# Patient Record
Sex: Female | Born: 1960 | Race: Black or African American | Hispanic: No | Marital: Married | Smoking: Never smoker
Health system: Southern US, Community
[De-identification: ages and names within clinical notes are randomized; demographics above are authoritative.]

## PROBLEM LIST (undated history)

## (undated) DIAGNOSIS — E559 Vitamin D deficiency, unspecified: Secondary | ICD-10-CM

## (undated) DIAGNOSIS — M8580 Other specified disorders of bone density and structure, unspecified site: Secondary | ICD-10-CM

## (undated) DIAGNOSIS — G629 Polyneuropathy, unspecified: Secondary | ICD-10-CM

## (undated) DIAGNOSIS — E119 Type 2 diabetes mellitus without complications: Secondary | ICD-10-CM

## (undated) DIAGNOSIS — G2581 Restless legs syndrome: Secondary | ICD-10-CM

## (undated) DIAGNOSIS — G47419 Narcolepsy without cataplexy: Secondary | ICD-10-CM

## (undated) DIAGNOSIS — M797 Fibromyalgia: Secondary | ICD-10-CM

## (undated) DIAGNOSIS — J302 Other seasonal allergic rhinitis: Secondary | ICD-10-CM

## (undated) HISTORY — DX: Polyneuropathy, unspecified: G62.9

## (undated) HISTORY — PX: CARPAL TUNNEL RELEASE: SHX101

## (undated) HISTORY — PX: TONSILLECTOMY: SUR1361

## (undated) HISTORY — DX: Vitamin D deficiency, unspecified: E55.9

## (undated) HISTORY — PX: KNEE ARTHROSCOPY: SUR90

## (undated) HISTORY — DX: Type 2 diabetes mellitus without complications: E11.9

## (undated) HISTORY — DX: Other specified disorders of bone density and structure, unspecified site: M85.80

## (undated) HISTORY — DX: Fibromyalgia: M79.7

## (undated) HISTORY — DX: Restless legs syndrome: G25.81

## (undated) HISTORY — DX: Narcolepsy without cataplexy: G47.419

---

## 1998-02-16 ENCOUNTER — Encounter: Payer: Self-pay | Admitting: Internal Medicine

## 1998-02-16 ENCOUNTER — Ambulatory Visit (HOSPITAL_COMMUNITY): Admission: RE | Admit: 1998-02-16 | Discharge: 1998-02-16 | Payer: Self-pay | Admitting: Internal Medicine

## 1998-02-18 ENCOUNTER — Ambulatory Visit (HOSPITAL_COMMUNITY): Admission: RE | Admit: 1998-02-18 | Discharge: 1998-02-18 | Payer: Self-pay | Admitting: Internal Medicine

## 1999-07-27 ENCOUNTER — Other Ambulatory Visit: Admission: RE | Admit: 1999-07-27 | Discharge: 1999-07-27 | Payer: Self-pay | Admitting: Gynecology

## 2000-01-19 ENCOUNTER — Encounter: Admission: RE | Admit: 2000-01-19 | Discharge: 2000-02-01 | Payer: Self-pay | Admitting: Internal Medicine

## 2000-05-30 ENCOUNTER — Encounter: Admission: RE | Admit: 2000-05-30 | Discharge: 2000-07-05 | Payer: Self-pay | Admitting: Internal Medicine

## 2000-08-01 ENCOUNTER — Other Ambulatory Visit: Admission: RE | Admit: 2000-08-01 | Discharge: 2000-08-01 | Payer: Self-pay | Admitting: Gynecology

## 2000-10-05 ENCOUNTER — Encounter: Admission: RE | Admit: 2000-10-05 | Discharge: 2000-10-05 | Payer: Self-pay | Admitting: Gynecology

## 2000-10-05 ENCOUNTER — Encounter: Payer: Self-pay | Admitting: Gynecology

## 2002-02-14 ENCOUNTER — Ambulatory Visit (HOSPITAL_BASED_OUTPATIENT_CLINIC_OR_DEPARTMENT_OTHER): Admission: RE | Admit: 2002-02-14 | Discharge: 2002-02-14 | Payer: Self-pay | Admitting: Internal Medicine

## 2002-05-12 ENCOUNTER — Ambulatory Visit (HOSPITAL_BASED_OUTPATIENT_CLINIC_OR_DEPARTMENT_OTHER): Admission: RE | Admit: 2002-05-12 | Discharge: 2002-05-12 | Payer: Self-pay | Admitting: Neurology

## 2002-06-10 ENCOUNTER — Other Ambulatory Visit: Admission: RE | Admit: 2002-06-10 | Discharge: 2002-06-10 | Payer: Self-pay | Admitting: Gynecology

## 2002-07-01 ENCOUNTER — Encounter: Payer: Self-pay | Admitting: Gynecology

## 2002-07-01 ENCOUNTER — Encounter: Admission: RE | Admit: 2002-07-01 | Discharge: 2002-07-01 | Payer: Self-pay | Admitting: Gynecology

## 2003-07-21 ENCOUNTER — Other Ambulatory Visit: Admission: RE | Admit: 2003-07-21 | Discharge: 2003-07-21 | Payer: Self-pay | Admitting: Gynecology

## 2003-10-15 ENCOUNTER — Encounter: Admission: RE | Admit: 2003-10-15 | Discharge: 2003-10-15 | Payer: Self-pay | Admitting: Gynecology

## 2003-12-29 ENCOUNTER — Encounter: Admission: RE | Admit: 2003-12-29 | Discharge: 2004-01-22 | Payer: Self-pay | Admitting: Internal Medicine

## 2004-02-22 ENCOUNTER — Other Ambulatory Visit: Admission: RE | Admit: 2004-02-22 | Discharge: 2004-02-22 | Payer: Self-pay | Admitting: Gynecology

## 2004-07-21 ENCOUNTER — Other Ambulatory Visit: Admission: RE | Admit: 2004-07-21 | Discharge: 2004-07-21 | Payer: Self-pay | Admitting: Gynecology

## 2005-03-07 ENCOUNTER — Other Ambulatory Visit: Admission: RE | Admit: 2005-03-07 | Discharge: 2005-03-07 | Payer: Self-pay | Admitting: Gynecology

## 2005-08-08 ENCOUNTER — Other Ambulatory Visit: Admission: RE | Admit: 2005-08-08 | Discharge: 2005-08-08 | Payer: Self-pay | Admitting: Gynecology

## 2005-09-11 ENCOUNTER — Encounter: Admission: RE | Admit: 2005-09-11 | Discharge: 2005-09-11 | Payer: Self-pay | Admitting: Gynecology

## 2006-03-22 ENCOUNTER — Emergency Department (HOSPITAL_COMMUNITY): Admission: EM | Admit: 2006-03-22 | Discharge: 2006-03-22 | Payer: Self-pay | Admitting: Emergency Medicine

## 2006-04-04 ENCOUNTER — Ambulatory Visit (HOSPITAL_COMMUNITY): Admission: RE | Admit: 2006-04-04 | Discharge: 2006-04-04 | Payer: Self-pay | Admitting: Occupational Medicine

## 2006-04-23 ENCOUNTER — Ambulatory Visit (HOSPITAL_BASED_OUTPATIENT_CLINIC_OR_DEPARTMENT_OTHER): Admission: RE | Admit: 2006-04-23 | Discharge: 2006-04-23 | Payer: Self-pay | Admitting: Orthopedic Surgery

## 2006-05-04 ENCOUNTER — Encounter: Admission: RE | Admit: 2006-05-04 | Discharge: 2006-06-20 | Payer: Self-pay | Admitting: Orthopedic Surgery

## 2006-09-20 ENCOUNTER — Other Ambulatory Visit: Admission: RE | Admit: 2006-09-20 | Discharge: 2006-09-20 | Payer: Self-pay | Admitting: Gynecology

## 2006-11-28 ENCOUNTER — Encounter: Admission: RE | Admit: 2006-11-28 | Discharge: 2006-11-28 | Payer: Self-pay | Admitting: Gynecology

## 2007-09-15 ENCOUNTER — Emergency Department (HOSPITAL_COMMUNITY): Admission: EM | Admit: 2007-09-15 | Discharge: 2007-09-15 | Payer: Self-pay | Admitting: Family Medicine

## 2007-11-01 ENCOUNTER — Other Ambulatory Visit: Admission: RE | Admit: 2007-11-01 | Discharge: 2007-11-01 | Payer: Self-pay | Admitting: Gynecology

## 2008-09-07 ENCOUNTER — Emergency Department (HOSPITAL_COMMUNITY): Admission: EM | Admit: 2008-09-07 | Discharge: 2008-09-07 | Payer: Self-pay | Admitting: Emergency Medicine

## 2008-09-24 ENCOUNTER — Encounter: Admission: RE | Admit: 2008-09-24 | Discharge: 2008-09-24 | Payer: Self-pay | Admitting: Gynecology

## 2009-03-03 ENCOUNTER — Ambulatory Visit (HOSPITAL_COMMUNITY): Admission: RE | Admit: 2009-03-03 | Discharge: 2009-03-03 | Payer: Self-pay | Admitting: Internal Medicine

## 2009-03-06 HISTORY — PX: OTHER SURGICAL HISTORY: SHX169

## 2009-04-16 ENCOUNTER — Ambulatory Visit (HOSPITAL_BASED_OUTPATIENT_CLINIC_OR_DEPARTMENT_OTHER): Admission: RE | Admit: 2009-04-16 | Discharge: 2009-04-16 | Payer: Self-pay | Admitting: Orthopedic Surgery

## 2009-05-08 ENCOUNTER — Ambulatory Visit (HOSPITAL_COMMUNITY): Admission: RE | Admit: 2009-05-08 | Discharge: 2009-05-08 | Payer: Self-pay | Admitting: Orthopedic Surgery

## 2009-11-09 ENCOUNTER — Encounter: Admission: RE | Admit: 2009-11-09 | Discharge: 2009-11-09 | Payer: Self-pay | Admitting: Gynecology

## 2010-03-26 ENCOUNTER — Encounter: Payer: Self-pay | Admitting: Gynecology

## 2010-07-22 NOTE — Op Note (Signed)
Diana White, Diana White                 ACCOUNT NO.:  [id number]   MEDICAL RECORD NO.:  [id number]          PATIENT TYPE:  AMB   LOCATION:  DSC                          FACILITY:  [facility]   PHYSICIAN:  Robert A. Thurston Hole, M.D. 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   PROCEDURES:  1. Left knee examination under anesthesia, followed by arthroscopic      partial lateral meniscectomy.  2. Left knee chondroplasty.   SURGEON:  Elana Alm. Thurston Hole, M.D.   ASSISTANT:  Julien Girt, P.A.   ANESTHESIA:  General.   OPERATIVE TIME:  30 minutes.   COMPLICATIONS:  None.   INDICATIONS FOR PROCEDURE:  [name] is a 50 year old woman, who has  had significant left knee pain from an injury at work on March 22, 2006, a twisting type injury.  Significant pain and swelling.  Exam and  MRI reveal a lateral meniscus tear.  She has failed conservative care  and is now to undergo arthroscopy.   DESCRIPTION:  [name] was brought to the operating room on April 23, 2006, and placed on the operating table in supine position.  After  an adequate level of general anesthesia was obtained, her left knee was  examined.  She had a full range of motion and her knee was stable to  ligamentous exam.  The knee was injected under sterile conditions with  0.25% Marcaine with epinephrine.  Her left leg was then prepped using  sterile DuraPrep and draped using sterile technique.  Originally,  through an anterolateral portal, the arthroscope with a pump attached  was placed, and through an anteromedial portal an arthroscopic probe was  placed.  On initial inspection of the medial compartment, the articular  cartilage showed 30 to 40% grade 3 chondromalacia,  which was debrided.  The medial meniscus was intact.  The intercondylar notch was inspected  and the anterior and posterior cruciate ligaments were normal.  The  lateral compartment was inspected: 25 to 30% chondromalacia, which was  debrided.  Lateral meniscus tear, posterior and lateral horn, of which  30% was resected back to a stable rim.  The patellofemoral joint showed  30 to 40% grade 3 chondromalacia on the patellofemoral groove, and this  was debrided.  The patella tracked normally.  Moderate synovitis of the  medial and lateral gutters, otherwise, they were free of pathology.  After this was done and it was felt that all pathology had been  satisfactorily addressed, the instruments were removed.  The portals  were closed with 3-0 nylon suture and injected with 0.25% Marcaine with  epinephrine and 4 mg of morphine.  A sterile dressing was applied, and  the patient awakened and taken to the recovery room in stable condition.   FOLLOWUP CARE:  [name] will be followed as an outpatient on Vicodin  and Naprosyn.  I will see her back in the office in a week for sutures  out and followup.      Robert A. Thurston Hole, M.D.  Electronically Signed     RAW/MEDQ  D:  04/23/2006  T:  04/24/2006  Job:  [id number]   cc:   Workers Occupational hygienist.

## 2010-08-30 ENCOUNTER — Other Ambulatory Visit (HOSPITAL_COMMUNITY): Payer: Self-pay | Admitting: Internal Medicine

## 2010-08-30 DIAGNOSIS — K5792 Diverticulitis of intestine, part unspecified, without perforation or abscess without bleeding: Secondary | ICD-10-CM

## 2010-09-01 ENCOUNTER — Ambulatory Visit (HOSPITAL_COMMUNITY)
Admission: RE | Admit: 2010-09-01 | Discharge: 2010-09-01 | Disposition: A | Discharge status: Home / Self Care | Payer: 59 | Source: Ambulatory Visit | Attending: Internal Medicine | Admitting: Internal Medicine

## 2010-09-01 DIAGNOSIS — R1032 Left lower quadrant pain: Secondary | ICD-10-CM | POA: Insufficient documentation

## 2010-09-01 DIAGNOSIS — N859 Noninflammatory disorder of uterus, unspecified: Secondary | ICD-10-CM | POA: Insufficient documentation

## 2010-09-01 DIAGNOSIS — K5792 Diverticulitis of intestine, part unspecified, without perforation or abscess without bleeding: Secondary | ICD-10-CM

## 2010-09-01 MED ORDER — IOHEXOL 300 MG/ML  SOLN: 80.0000 mL | Freq: Once | INTRAMUSCULAR | Status: AC | PRN

## 2010-12-22 ENCOUNTER — Ambulatory Visit
Admission: RE | Admit: 2010-12-22 | Discharge: 2010-12-22 | Disposition: A | Discharge status: Home / Self Care | Payer: 59 | Source: Ambulatory Visit | Attending: Gynecology | Admitting: Gynecology

## 2010-12-22 ENCOUNTER — Other Ambulatory Visit: Payer: Self-pay | Admitting: Gynecology

## 2010-12-22 DIAGNOSIS — Z1231 Encounter for screening mammogram for malignant neoplasm of breast: Secondary | ICD-10-CM

## 2012-01-04 ENCOUNTER — Other Ambulatory Visit: Payer: Self-pay | Admitting: Gynecology

## 2012-01-04 DIAGNOSIS — Z1231 Encounter for screening mammogram for malignant neoplasm of breast: Secondary | ICD-10-CM

## 2012-02-16 ENCOUNTER — Ambulatory Visit: Payer: 59

## 2012-08-31 ENCOUNTER — Emergency Department (HOSPITAL_COMMUNITY)
Admission: EM | Admit: 2012-08-31 | Discharge: 2012-08-31 | Disposition: A | Discharge status: Home / Self Care | Payer: 59 | Attending: Emergency Medicine | Admitting: Emergency Medicine

## 2012-08-31 ENCOUNTER — Encounter (HOSPITAL_COMMUNITY): Payer: Self-pay | Admitting: Physical Medicine and Rehabilitation

## 2012-08-31 ENCOUNTER — Encounter (HOSPITAL_COMMUNITY): Payer: Self-pay | Admitting: Emergency Medicine

## 2012-08-31 ENCOUNTER — Emergency Department (HOSPITAL_COMMUNITY)
Admission: EM | Admit: 2012-08-31 | Discharge: 2012-08-31 | Disposition: A | Discharge status: Home / Self Care | Payer: 59 | Source: Home / Self Care | Attending: Emergency Medicine | Admitting: Emergency Medicine

## 2012-08-31 DIAGNOSIS — M79609 Pain in unspecified limb: Secondary | ICD-10-CM

## 2012-08-31 DIAGNOSIS — M79604 Pain in right leg: Secondary | ICD-10-CM

## 2012-08-31 DIAGNOSIS — IMO0001 Reserved for inherently not codable concepts without codable children: Secondary | ICD-10-CM | POA: Insufficient documentation

## 2012-08-31 DIAGNOSIS — M7989 Other specified soft tissue disorders: Secondary | ICD-10-CM

## 2012-08-31 DIAGNOSIS — M79661 Pain in right lower leg: Secondary | ICD-10-CM

## 2012-08-31 HISTORY — DX: Other seasonal allergic rhinitis: J30.2

## 2012-08-31 LAB — COMPREHENSIVE METABOLIC PANEL
ALT: 17 U/L (ref 0–35)
Alkaline Phosphatase: 181 U/L — ABNORMAL HIGH (ref 39–117)
Calcium: 9.9 mg/dL (ref 8.4–10.5)
Chloride: 105 mEq/L (ref 96–112)
GFR calc non Af Amer: >90 mL/min (ref >90)
Glucose, Bld: 85 mg/dL (ref 70–99)
Sodium: 141 mEq/L (ref 135–145)
Total Bilirubin: 0.4 mg/dL (ref 0.3–1.2)

## 2012-08-31 LAB — CBC WITH DIFFERENTIAL/PLATELET
Basophils Absolute: 0 10*3/uL (ref 0.0–0.1)
Basophils Relative: 0 % (ref 0–1)
Eosinophils Absolute: 0.2 10*3/uL (ref 0.0–0.7)
Eosinophils Relative: 3 % (ref 0–5)
HCT: 43.5 % (ref 36.0–46.0)
Hemoglobin: 15.3 g/dL — ABNORMAL HIGH (ref 12.0–15.0)
Lymphocytes Relative: 45 % (ref 12–46)
Lymphs Abs: 2.6 10*3/uL (ref 0.7–4.0)
MCH: 31.4 pg (ref 26.0–34.0)
MCV: 89.3 fL (ref 78.0–100.0)
Monocytes Absolute: 0.3 10*3/uL (ref 0.1–1.0)
RBC: 4.87 MIL/uL (ref 3.87–5.11)
RDW: 12.5 % (ref 11.5–15.5)
WBC: 5.8 10*3/uL (ref 4.0–10.5)

## 2012-08-31 LAB — PROTIME-INR: Prothrombin Time: 12.2 seconds (ref 11.6–15.2)

## 2012-08-31 NOTE — ED Provider Notes (Signed)
Medical screening examination/treatment/procedure(s) were performed by non-physician practitioner and as supervising physician I was immediately available for consultation/collaboration. Devoria Albe, MD, Armando Gang   Ward Givens, MD 08/31/12 [phone]

## 2012-08-31 NOTE — ED Notes (Signed)
Pt c/o right lower leg pain x 1 wk. Pt states that with flexing and extending for foot pain is felt at the mid part of the sheen.  Gradually onset. Pain is felt with walking. Pt has used old script celebrex that was left over and helped with pain.  No relief with reg otc medications.

## 2012-08-31 NOTE — ED Provider Notes (Signed)
   History    CSN: [id number] Arrival date & time 08/31/12  1341  First MD Initiated Contact with Patient 08/31/12 1352     Chief Complaint  Patient presents with  . Leg Pain   (Consider location/radiation/quality/duration/timing/severity/associated sxs/prior Treatment) HPI  PCP- Dr. Kevan Ny, R.  Patient sent to the ED from Urgent Care for further evaluation for right calf pain with concerns of DVT. The pain is worse with movement, walking and flexing. No recent travels, no hormone therapy, no history of blood clots. +family history of DVT (sister). The pain is a severe 8/10 when she walks. She has not had any chest pain or SOB. He tried taking Celebrex and it did help with the pain. The symptoms started 1 week ago. Pt denies significant past medical history.  Past Medical History  Diagnosis Date  . Seasonal allergies    Past Surgical History  Procedure Laterality Date  . Knee arthroscopy    . Carpal tunnel release     No family history on file. History  Substance Use Topics  . Smoking status: Never Smoker   . Smokeless tobacco: Not on file  . Alcohol Use: Yes     Comment: socially   OB History   Grav Para Term Preterm Abortions TAB SAB Ect Mult Living                 Review of Systems  Respiratory: Negative for shortness of breath and wheezing.   Cardiovascular: Positive for leg swelling. Negative for chest pain.  Musculoskeletal: Positive for myalgias.  All other systems reviewed and are negative.    Allergies  Sulfa antibiotics  Home Medications  No current outpatient prescriptions on file. BP 133/93  Pulse 82  Temp(Src) 98.5 F (36.9 C) (Oral)  Resp 18  SpO2 98% Physical Exam  Nursing note and vitals reviewed. Constitutional: She appears well-developed and well-nourished. No distress.  HENT:  Head: Normocephalic and atraumatic.  Eyes: Pupils are equal, round, and reactive to light.  Neck: Normal range of motion. Neck supple.  Cardiovascular: Normal  rate and regular rhythm.   Pulmonary/Chest: Effort normal.  Abdominal: Soft.  Musculoskeletal:       Legs: Tenderness to right calf. Mild swelling in comparison to left. Claudication with flexion of calf. Right pedal pulse is palpable and strong Skin is warm and moist  Neurological: She is alert.  Skin: Skin is warm and dry.    ED Course  Procedures (including critical care time) Labs Reviewed  CBC WITH DIFFERENTIAL  COMPREHENSIVE METABOLIC PANEL  PROTIME-INR   No results found. No diagnosis found.  MDM    Labs and Lower Extremity US dopplers pending.  Patient offered pain medication and declines at this time.  3:00pm- End of shift care handed over to [facility], PA-C   Dopplers are pending.  Dorthula Matas, PA-C 08/31/12 1511

## 2012-08-31 NOTE — Progress Notes (Signed)
VASCULAR LAB PRELIMINARY  PRELIMINARY  PRELIMINARY  PRELIMINARY  Right lower extremity venous Doppler completed.    Preliminary report:  There is no DVT or SVT noted in the right lower extremity.  Kalasia Crafton, RVT 08/31/2012, 3:32 PM

## 2012-08-31 NOTE — ED Notes (Signed)
Pt presents to department from [facility] for evaluation of possible DVT. States R lower leg pain/calf tenderness x1 week. Pain increases with flexing and extending leg. 8/10 pain at the time. Pt is alert and oriented x4.

## 2012-08-31 NOTE — ED Provider Notes (Signed)
Patient care assumed from Marlon Pel, PA-C at shift change with doppler of RLE pending.  Vascular study with no evidence of DVT or SVT in RLE. Patient is well and nontoxic appearing, in no acute distress, and hemodynamically stable. On my examination patient has 2+ PT and DP pulses in her bilateral lower extremities and normal capillary refill. Neurovascularly intact. Patient appropriate for discharge with primary care followup for further evaluation of symptoms as needed. Indications for ED return discussed the patient who verbalizes comfort and understanding with plan.   VASCULAR LAB  PRELIMINARY PRELIMINARY PRELIMINARY PRELIMINARY  Right lower extremity venous Doppler completed.  Preliminary report: There is no DVT or SVT noted in the right lower extremity.  KANADY, CANDACE, RVT  08/31/2012, 3:32 PM  Results for orders placed during the hospital encounter of 08/31/12  CBC WITH DIFFERENTIAL      Result Value Range   WBC 5.8  4.0 - 10.5 K/uL   RBC 4.87  3.87 - 5.11 MIL/uL   Hemoglobin 15.3 (*) 12.0 - 15.0 g/dL   HCT 40.9  81.1 - 91.4 %   MCV 89.3  78.0 - 100.0 fL   MCH 31.4  26.0 - 34.0 pg   MCHC 35.2  30.0 - 36.0 g/dL   RDW 78.2  95.6 - 21.3 %   Platelets 248  150 - 400 K/uL   Neutrophils Relative % 47  43 - 77 %   Neutro Abs 2.7  1.7 - 7.7 K/uL   Lymphocytes Relative 45  12 - 46 %   Lymphs Abs 2.6  0.7 - 4.0 K/uL   Monocytes Relative 4  3 - 12 %   Monocytes Absolute 0.3  0.1 - 1.0 K/uL   Eosinophils Relative 3  0 - 5 %   Eosinophils Absolute 0.2  0.0 - 0.7 K/uL   Basophils Relative 0  0 - 1 %   Basophils Absolute 0.0  0.0 - 0.1 K/uL  COMPREHENSIVE METABOLIC PANEL      Result Value Range   Sodium 141  135 - 145 mEq/L   Potassium 4.2  3.5 - 5.1 mEq/L   Chloride 105  96 - 112 mEq/L   CO2 27  19 - 32 mEq/L   Glucose, Bld 85  70 - 99 mg/dL   BUN 13  6 - 23 mg/dL   Creatinine, Ser 0.86  0.50 - 1.10 mg/dL   Calcium 9.9  8.4 - 57.8 mg/dL   Total Protein 7.5  6.0 - 8.3 g/dL    Albumin 3.9  3.5 - 5.2 g/dL   AST 19  0 - 37 U/L   ALT 17  0 - 35 U/L   Alkaline Phosphatase 181 (*) 39 - 117 U/L   Total Bilirubin 0.4  0.3 - 1.2 mg/dL   GFR calc non Af Amer >90  >90 mL/min   GFR calc Af Amer >90  >90 mL/min  PROTIME-INR      Result Value Range   Prothrombin Time 12.2  11.6 - 15.2 seconds   INR 0.92  0.00 - 1.49    Antony Madura, PA-C 08/31/12 1549

## 2012-08-31 NOTE — ED Notes (Signed)
Patient placed on all monitors and denies pain at this time.

## 2012-08-31 NOTE — ED Provider Notes (Signed)
   History    CSN: [id number] Arrival date & time 08/31/12  1341  First MD Initiated Contact with Patient 08/31/12 1352     Chief Complaint  Patient presents with  . Leg Pain   (Consider location/radiation/quality/duration/timing/severity/associated sxs/prior Treatment) HPI  52 yo bf presents today with right calf pain and swelling.  States that symptoms ongoing x 1 week.  No injury.  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  No complaints of fever, chills, cp, sob, knee pain.  Has taken celebrex without improvement.  Pain and swelling worse the more she is on her feet.   Past Medical History  Diagnosis Date  . Seasonal allergies    Past Surgical History  Procedure Laterality Date  . Knee arthroscopy    . Carpal tunnel release     No family history on file. History  Substance Use Topics  . Smoking status: Never Smoker   . Smokeless tobacco: Not on file  . Alcohol Use: Yes     Comment: socially   OB History   Grav Para Term Preterm Abortions TAB SAB Ect Mult Living                 Review of Systems  Constitutional: Negative.   HENT: Negative.   Eyes: Negative.   Respiratory: Negative.   Cardiovascular: Negative.   Gastrointestinal: Negative.   Endocrine: Negative.   Genitourinary: Negative.   Musculoskeletal:       Calf pain and swelling  Skin: Negative.   Neurological: Negative.   Hematological: Negative.   Psychiatric/Behavioral: Negative.     Allergies  Sulfa antibiotics  Home Medications  No current outpatient prescriptions on file. BP 133/93  Pulse 82  Temp(Src) 98.5 F (36.9 C) (Oral)  Resp 18  SpO2 98% Physical Exam  Constitutional: She is oriented to person, place, and time. She appears well-developed and well-nourished.  HENT:  Head: Normocephalic and atraumatic.  Eyes: Pupils are equal, round, and reactive to light.  Neck: Normal range of motion.   Cardiovascular: Normal rate and regular rhythm.   Pulmonary/Chest: Effort normal and breath sounds normal.  Musculoskeletal:  2-3 pretib pitting edema on the right.  Anterior tib somewhat tender.  Mod to marked prox to mid calf tenderness.  Mod size bakers cyst.  Knee good ROM, joint line tender.    Neurological: She is alert and oriented to person, place, and time.  Skin: Skin is warm and dry.    ED Course  Procedures (including critical care time) Labs Reviewed  CBC WITH DIFFERENTIAL  COMPREHENSIVE METABOLIC PANEL  PROTIME-INR   No results found. No diagnosis found.  MDM  Will send patient down to ED for venous doppler to r/o dvt.  Voices understanding.   Zonia Kief, PA-C 08/31/12 1424

## 2012-08-31 NOTE — ED Provider Notes (Signed)
Medical screening examination/treatment/procedure(s) were performed by non-physician practitioner and as supervising physician I was immediately available for consultation/collaboration. Devoria Albe, MD, Armando Gang   Ward Givens, MD 08/31/12 1524

## 2012-08-31 NOTE — ED Provider Notes (Signed)
Medical screening examination/treatment/procedure(s) were performed by non-physician practitioner and as supervising physician I was immediately available for consultation/collaboration.   Jaquisha Frech, MD 08/31/12 2333 

## 2012-09-02 NOTE — ED Provider Notes (Signed)
Medical screening examination/treatment/procedure(s) were performed by non-physician practitioner and as supervising physician I was immediately available for consultation/collaboration.  Demiah Gullickson, M.D.  Haim Hansson C Orlandus Borowski, MD 09/02/12 2150 

## 2012-09-02 NOTE — ED Provider Notes (Signed)
History    CSN: [id number] Arrival date & time 08/31/12  1125  First MD Initiated Contact with Patient 08/31/12 1306     Chief Complaint  Patient presents with  . Leg Pain    right lower leg pain x 1 wk. denies injury.    (Consider location/radiation/quality/duration/timing/severity/associated sxs/prior Treatment) HPI  52 yo bf presents with complaint of right calf pain x one week.  Pain with ambulation and has had swelling.  No injury but states that she has attended a couple of conferences over the last couple of weekends that required her to do a lot of sitting.  She is a Charity fundraiser with .  Denies cp, sob, fever, chills, hip/knee pain.   Past Medical History  Diagnosis Date  . Seasonal allergies    Past Surgical History  Procedure Laterality Date  . Knee arthroscopy    . Carpal tunnel release     History reviewed. No pertinent family history. History  Substance Use Topics  . Smoking status: Never Smoker   . Smokeless tobacco: Not on file  . Alcohol Use: Yes     Comment: socially   OB History   Grav Para Term Preterm Abortions TAB SAB Ect Mult Living                 Review of Systems  Constitutional: Negative.   HENT: Negative.   Eyes: Negative.   Respiratory: Negative.   Gastrointestinal: Negative.   Endocrine: Negative.   Genitourinary: Negative.   Musculoskeletal:       Right calf pain.   Neurological: Negative.   Hematological: Negative.   Psychiatric/Behavioral: Negative.     Allergies  Sulfa antibiotics  Home Medications   Current Outpatient Rx  Name  Route  Sig  Dispense  Refill  . aspirin EC 81 MG tablet   Oral   Take 81 mg by mouth daily as needed for pain.         . celecoxib (CELEBREX) 200 MG capsule   Oral   Take 200-400 mg by mouth 2 (two) times daily as needed for pain.         Marland Kitchen diclofenac sodium (VOLTAREN) 1 % GEL   Topical   Apply 2 g topically 4 (four) times daily as needed (for pain).         . mometasone  (NASONEX) 50 MCG/ACT nasal spray   Nasal   Place 2 sprays into the nose daily as needed (seasonal allergies).         . naproxen sodium (ANAPROX) 220 MG tablet   Oral   Take 220 mg by mouth 2 (two) times daily as needed (for pain).          BP 138/62  Pulse 80  Temp(Src) 98.3 F (36.8 C) (Oral)  Resp 18  SpO2 99% Physical Exam  Constitutional: She is oriented to person, place, and time. She appears well-developed and well-nourished.  HENT:  Head: Normocephalic and atraumatic.  Eyes: EOM are normal. Pupils are equal, round, and reactive to light.  Neck: Normal range of motion.  Cardiovascular: Normal rate and regular rhythm.   Pulmonary/Chest: Effort normal and breath sounds normal.  Abdominal: Soft.  Musculoskeletal: Normal range of motion. She exhibits edema (2-3 right pretib pitting) and tenderness (right calf.  ).  Neurological: She is alert and oriented to person, place, and time.  Skin: Skin is warm and dry.  Psychiatric: She has a normal mood and affect.    ED Course  Procedures (including critical care time) Labs Reviewed - No data to display No results found. 1. Calf pain, right     MDM  Will transfer patient down to ED to get venous doppler ordered.  She voices understanding of treatment plan.  All questions answered.    Zonia Kief, PA-C 09/02/12 [phone]

## 2013-06-06 ENCOUNTER — Other Ambulatory Visit: Payer: Self-pay | Admitting: Internal Medicine

## 2013-06-06 ENCOUNTER — Ambulatory Visit
Admission: RE | Admit: 2013-06-06 | Discharge: 2013-06-06 | Disposition: A | Discharge status: Home / Self Care | Payer: 59 | Source: Ambulatory Visit | Attending: Internal Medicine | Admitting: Internal Medicine

## 2013-06-06 DIAGNOSIS — R748 Abnormal levels of other serum enzymes: Secondary | ICD-10-CM

## 2013-07-08 ENCOUNTER — Other Ambulatory Visit (HOSPITAL_COMMUNITY): Payer: Self-pay | Admitting: Internal Medicine

## 2013-07-08 DIAGNOSIS — R799 Abnormal finding of blood chemistry, unspecified: Secondary | ICD-10-CM

## 2013-07-08 DIAGNOSIS — M79609 Pain in unspecified limb: Secondary | ICD-10-CM

## 2013-07-08 DIAGNOSIS — R6884 Jaw pain: Secondary | ICD-10-CM

## 2013-07-21 ENCOUNTER — Encounter (HOSPITAL_COMMUNITY): Payer: 59

## 2013-09-11 ENCOUNTER — Encounter (HOSPITAL_COMMUNITY)
Admission: RE | Admit: 2013-09-11 | Discharge: 2013-09-11 | Disposition: A | Discharge status: Home / Self Care | Payer: 59 | Source: Ambulatory Visit | Attending: Internal Medicine | Admitting: Internal Medicine

## 2013-09-11 DIAGNOSIS — R6884 Jaw pain: Secondary | ICD-10-CM | POA: Insufficient documentation

## 2013-09-11 DIAGNOSIS — M79609 Pain in unspecified limb: Secondary | ICD-10-CM

## 2013-09-11 DIAGNOSIS — R799 Abnormal finding of blood chemistry, unspecified: Secondary | ICD-10-CM

## 2013-09-11 DIAGNOSIS — R748 Abnormal levels of other serum enzymes: Secondary | ICD-10-CM | POA: Insufficient documentation

## 2013-09-11 MED ORDER — TECHNETIUM TC 99M MEDRONATE IV KIT
24.0000 | PACK | Freq: Once | INTRAVENOUS | Status: AC | PRN
  Administered 2013-09-11: 24 via INTRAVENOUS

## 2013-09-30 ENCOUNTER — Ambulatory Visit
Admission: RE | Admit: 2013-09-30 | Discharge: 2013-09-30 | Disposition: A | Discharge status: Home / Self Care | Payer: 59 | Source: Ambulatory Visit | Attending: Internal Medicine | Admitting: Internal Medicine

## 2013-09-30 ENCOUNTER — Other Ambulatory Visit: Payer: Self-pay | Admitting: Internal Medicine

## 2013-09-30 DIAGNOSIS — R52 Pain, unspecified: Secondary | ICD-10-CM

## 2013-09-30 DIAGNOSIS — R799 Abnormal finding of blood chemistry, unspecified: Secondary | ICD-10-CM

## 2013-10-11 ENCOUNTER — Encounter: Payer: 59 | Attending: Internal Medicine | Admitting: Dietician

## 2013-10-11 DIAGNOSIS — Z713 Dietary counseling and surveillance: Secondary | ICD-10-CM | POA: Insufficient documentation

## 2013-10-11 DIAGNOSIS — E669 Obesity, unspecified: Secondary | ICD-10-CM | POA: Diagnosis not present

## 2013-10-11 NOTE — Progress Notes (Signed)
Patient was seen on 10/11/2013 for the Weight Loss Class at the [facility]. The following learning objectives were met by the patient during this class:   Describe healthy choices in each food group  Describe portion size of foods  Use plate method for meal planning  Demonstrate how to read Nutrition Facts food label  Set realistic goals for weight loss, diet changes, and physical activity.   Goals:  1. Make healthy food choices in each food group.  2. Reduce portion size of foods.  3. Increase fruit and vegetable intake.  4. Use plate method for meal planning.  5. Increase physical activity.   Handouts given:  1. Weight loss tips 2. Meal plan/portion card 2. Plate method  2. Food label handout

## 2015-04-01 DIAGNOSIS — Z1212 Encounter for screening for malignant neoplasm of rectum: Secondary | ICD-10-CM | POA: Diagnosis not present

## 2015-04-01 DIAGNOSIS — Z01419 Encounter for gynecological examination (general) (routine) without abnormal findings: Secondary | ICD-10-CM | POA: Diagnosis not present

## 2015-04-01 DIAGNOSIS — Z6825 Body mass index (BMI) 25.0-25.9, adult: Secondary | ICD-10-CM | POA: Diagnosis not present

## 2015-04-01 DIAGNOSIS — Z1231 Encounter for screening mammogram for malignant neoplasm of breast: Secondary | ICD-10-CM | POA: Diagnosis not present

## 2015-04-02 DIAGNOSIS — M79606 Pain in leg, unspecified: Secondary | ICD-10-CM | POA: Diagnosis not present

## 2015-04-02 DIAGNOSIS — M797 Fibromyalgia: Secondary | ICD-10-CM | POA: Diagnosis not present

## 2015-04-02 DIAGNOSIS — R7303 Prediabetes: Secondary | ICD-10-CM | POA: Diagnosis not present

## 2015-04-02 DIAGNOSIS — J309 Allergic rhinitis, unspecified: Secondary | ICD-10-CM | POA: Diagnosis not present

## 2015-04-02 DIAGNOSIS — E559 Vitamin D deficiency, unspecified: Secondary | ICD-10-CM | POA: Diagnosis not present

## 2015-04-02 MED FILL — FLUTICASONE PROP 50 MCG SPR: 50 | 30 days supply | Qty: 16 | Fill #0

## 2015-04-02 MED FILL — CYCLOBENZAPRINE 5 MG TABLET: 5 | 15 days supply | Qty: 15 | Fill #0

## 2015-04-15 DIAGNOSIS — R1032 Left lower quadrant pain: Secondary | ICD-10-CM | POA: Diagnosis not present

## 2015-04-16 ENCOUNTER — Other Ambulatory Visit (HOSPITAL_COMMUNITY): Payer: Self-pay | Admitting: Obstetrics and Gynecology

## 2015-04-16 DIAGNOSIS — R229 Localized swelling, mass and lump, unspecified: Principal | ICD-10-CM

## 2015-04-16 DIAGNOSIS — IMO0002 Reserved for concepts with insufficient information to code with codable children: Secondary | ICD-10-CM

## 2015-04-19 ENCOUNTER — Ambulatory Visit (HOSPITAL_COMMUNITY)
Admission: RE | Admit: 2015-04-19 | Discharge: 2015-04-19 | Disposition: A | Discharge status: Home / Self Care | Payer: 59 | Source: Ambulatory Visit | Attending: Obstetrics and Gynecology | Admitting: Obstetrics and Gynecology

## 2015-04-19 ENCOUNTER — Encounter (HOSPITAL_COMMUNITY): Payer: Self-pay

## 2015-04-19 DIAGNOSIS — R1907 Generalized intra-abdominal and pelvic swelling, mass and lump: Secondary | ICD-10-CM | POA: Insufficient documentation

## 2015-04-19 DIAGNOSIS — IMO0002 Reserved for concepts with insufficient information to code with codable children: Secondary | ICD-10-CM

## 2015-04-19 DIAGNOSIS — R1032 Left lower quadrant pain: Secondary | ICD-10-CM | POA: Insufficient documentation

## 2015-04-19 DIAGNOSIS — R229 Localized swelling, mass and lump, unspecified: Secondary | ICD-10-CM

## 2015-04-19 DIAGNOSIS — D259 Leiomyoma of uterus, unspecified: Secondary | ICD-10-CM | POA: Diagnosis not present

## 2015-04-19 MED ORDER — IOHEXOL 300 MG/ML  SOLN
100.0000 mL | Freq: Once | INTRAMUSCULAR | Status: AC | PRN
  Administered 2015-04-19: 100 mL via INTRAVENOUS

## 2015-04-22 DIAGNOSIS — H52203 Unspecified astigmatism, bilateral: Secondary | ICD-10-CM | POA: Diagnosis not present

## 2015-04-22 DIAGNOSIS — H5213 Myopia, bilateral: Secondary | ICD-10-CM | POA: Diagnosis not present

## 2016-07-31 IMAGING — CT CT PELVIS W/ CM
1 series · 15 of 32 positions shown, 19 images · IV contrast (OMNIPAQUE)
Comparison: CT on 09/01/2010

CLINICAL DATA: Left lower quadrant pain for 3 months. Left pelvic
mass seen on recent outside ultrasound.

EXAM:
CT PELVIS WITH CONTRAST
TECHNIQUE: Multidetector CT imaging of the pelvis was performed using the
standard protocol following the bolus administration of intravenous
contrast.
CONTRAST:  100mL OMNIPAQUE IOHEXOL 300 MG/ML  SOLN

[Series 2: rtn pelvis st · axial · 0.74mm/px · z∈[-686,-480]mm · 15 of 46 slices shown, 19 images]
[im 3/46  soft-tissue]
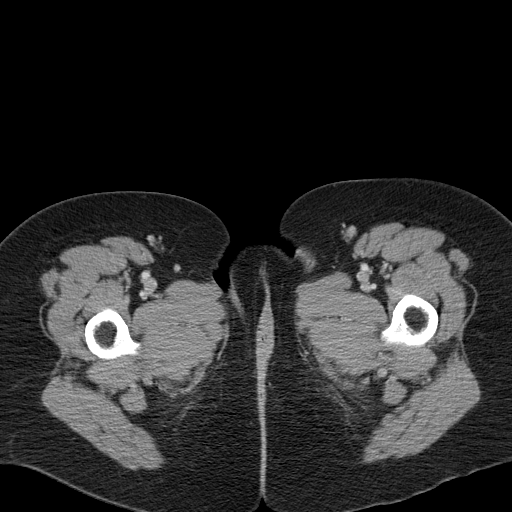
[im 3/46  bone]
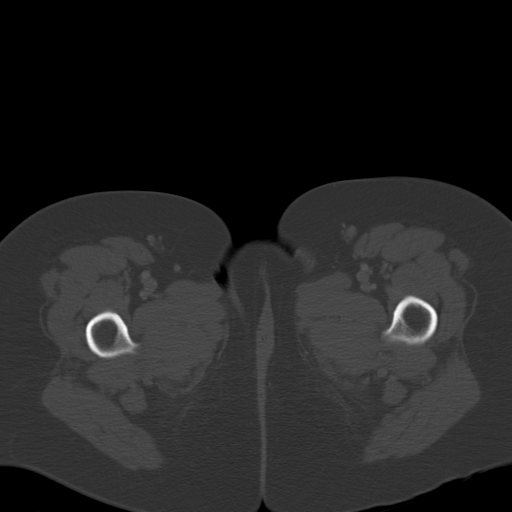
[im 6/46  soft-tissue]
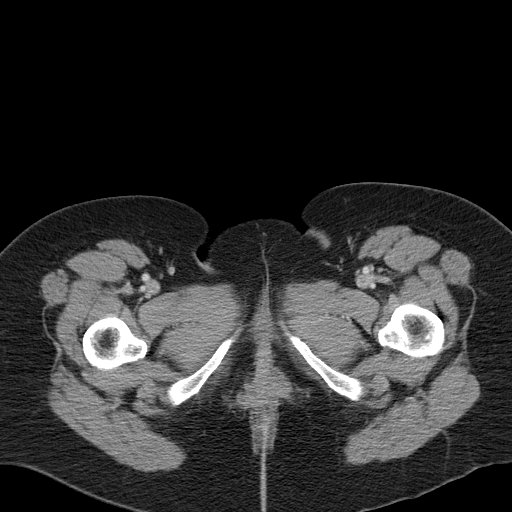
[im 9/46  soft-tissue]
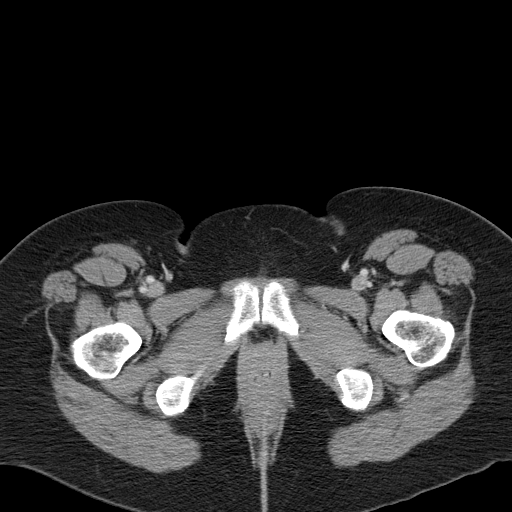
[im 14/46  soft-tissue]
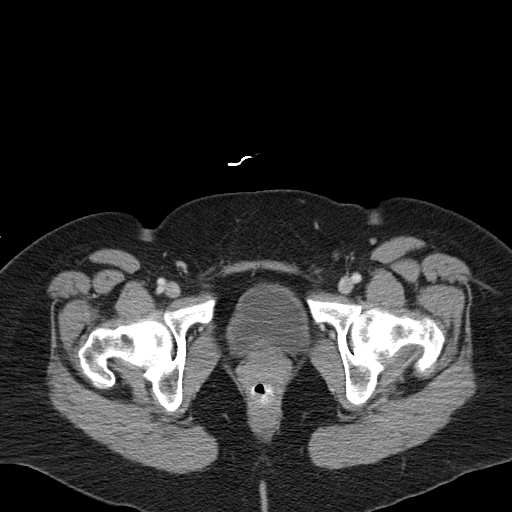
[im 16/46  soft-tissue]
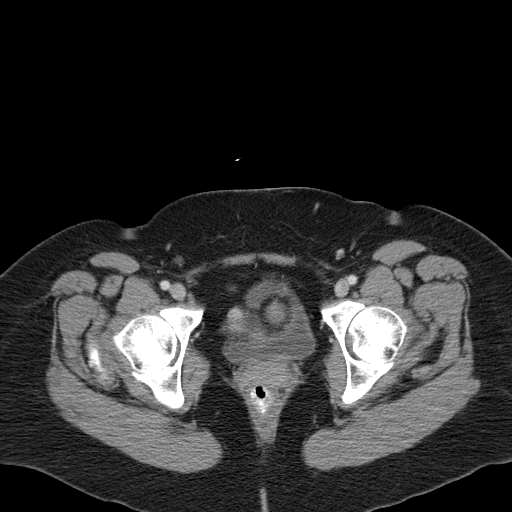
[im 19/46  soft-tissue]
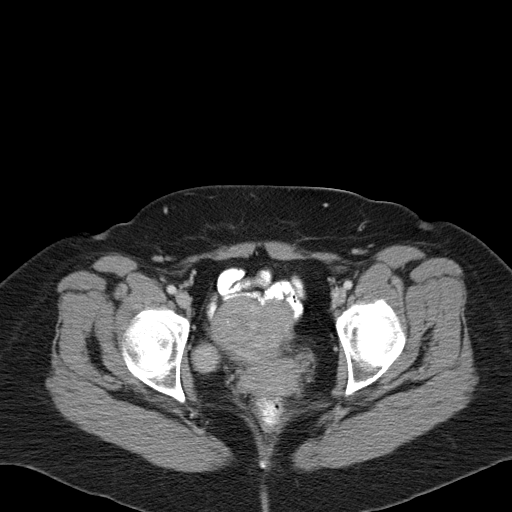
[im 24/46  soft-tissue]
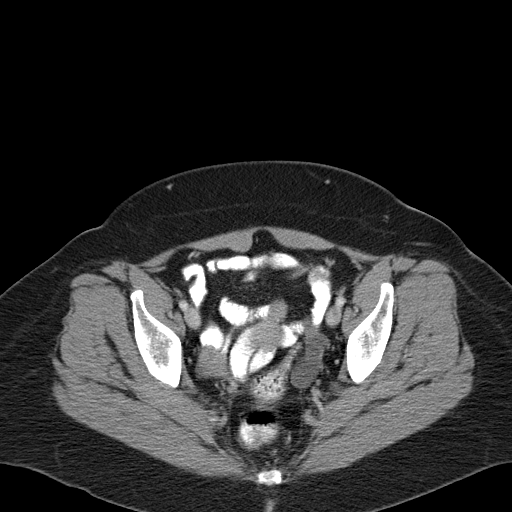
[im 27/46  soft-tissue]
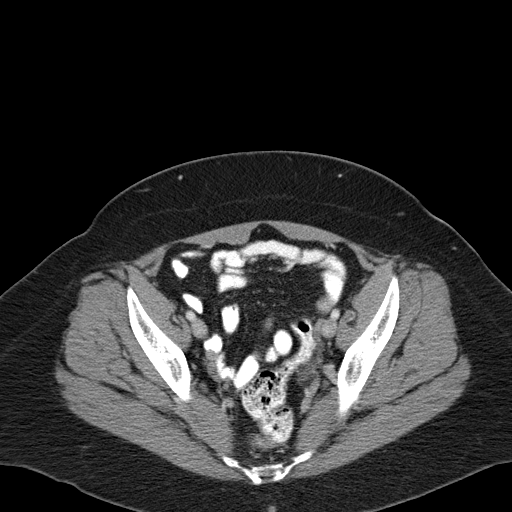
[im 30/46  soft-tissue]
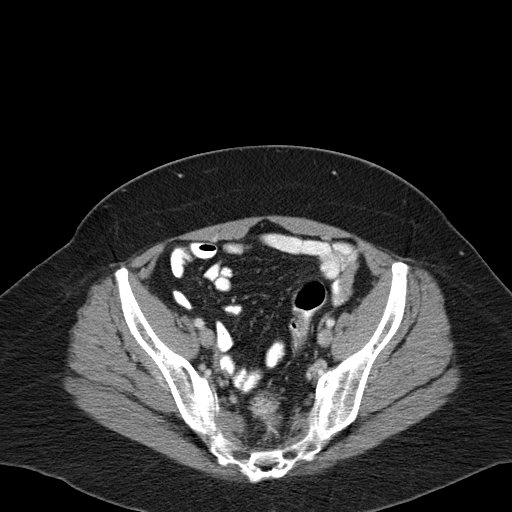
[im 30/46  bone]
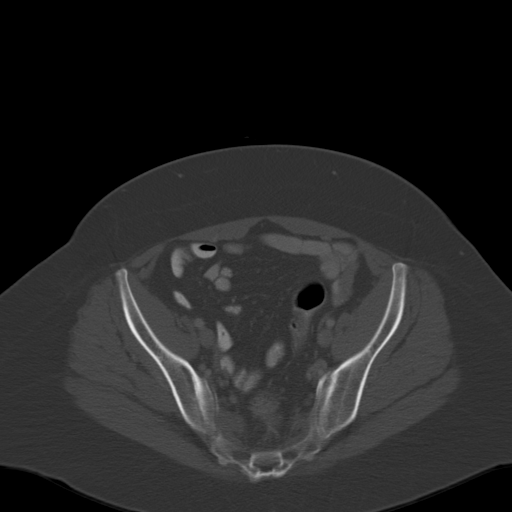
[im 32/46  soft-tissue]
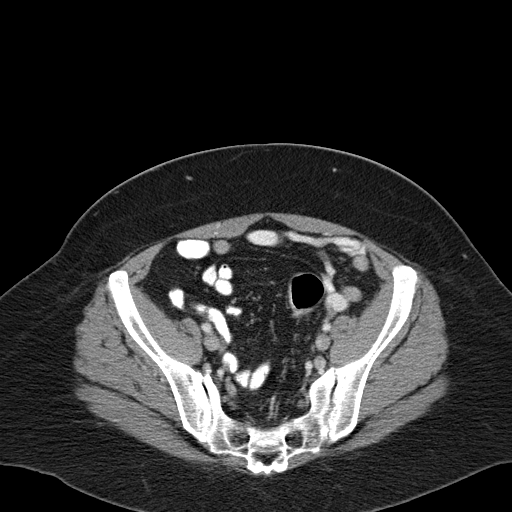
[im 37/46  soft-tissue]
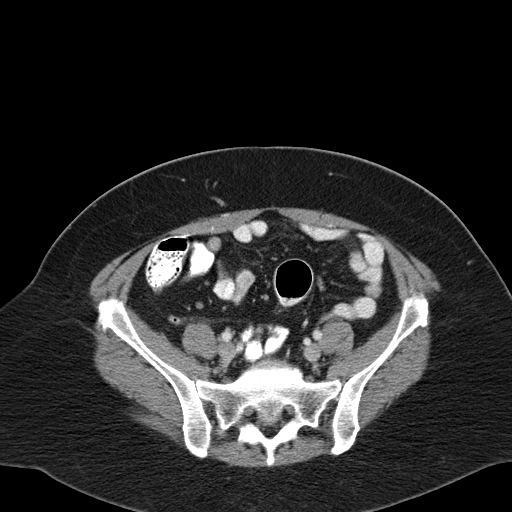
[im 40/46  soft-tissue]
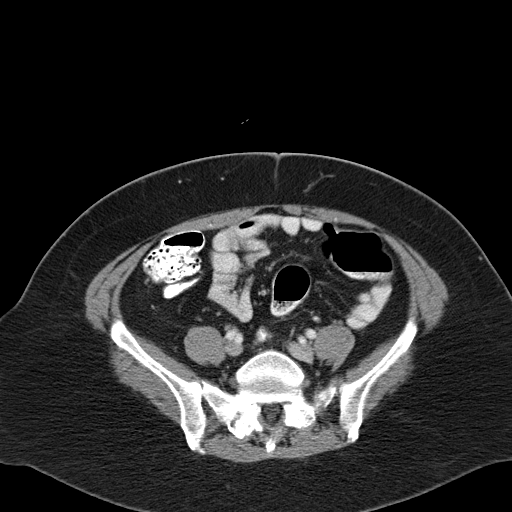
[im 40/46  lung]
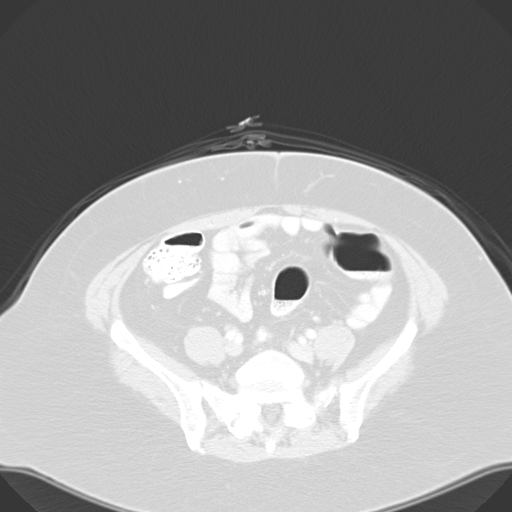
[im 41/46  lung]
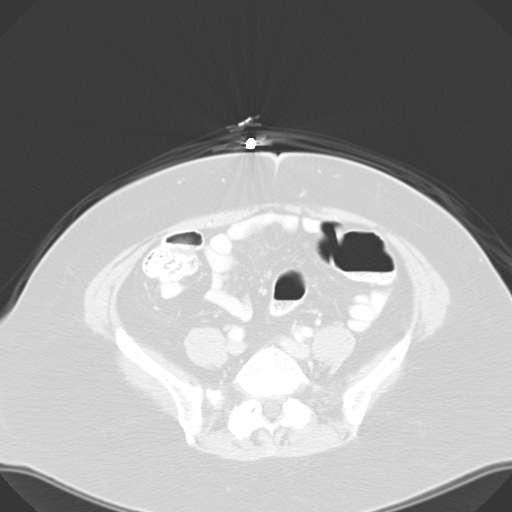
[im 43/46  soft-tissue]
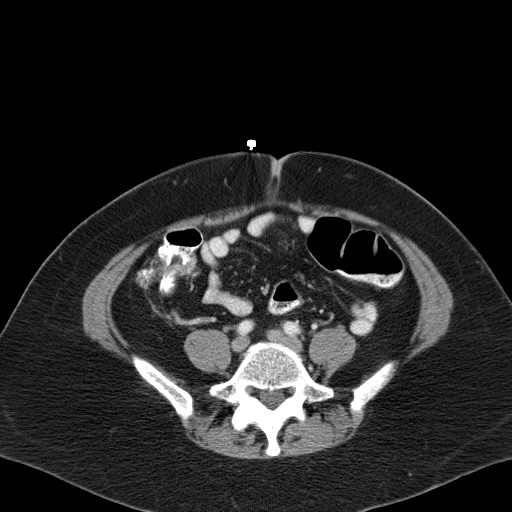
[im 43/46  lung]
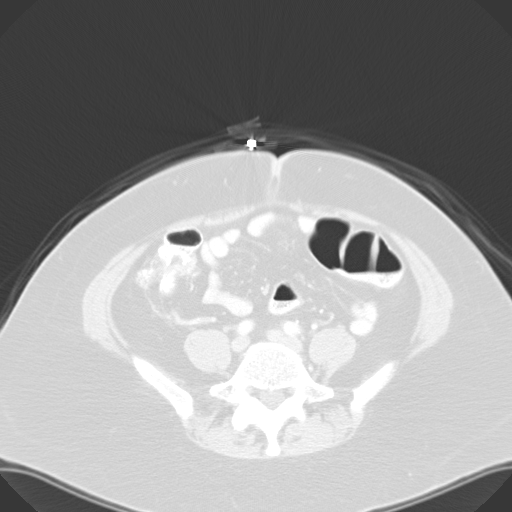
[im 44/46  lung]
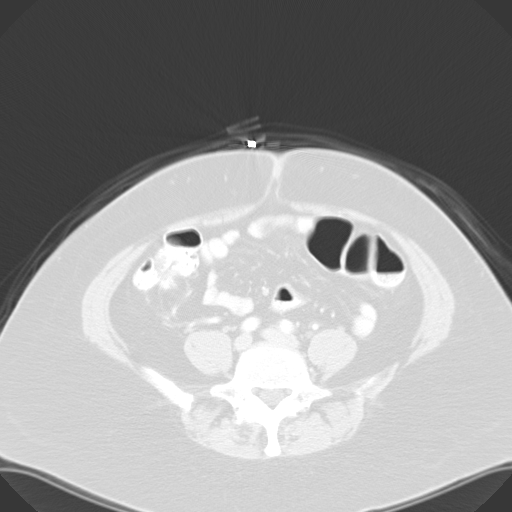

[15 of 32 positions shown; findings below may reference images not displayed]

FINDINGS: The uterus has a lobulated contour, consistent with the presence of
several uterine fibroids. A right adnexal mass is seen measuring
cm which is isodense with myometrium. This decreased in size since
previous study is consistent with a pedunculated fibroid.

A cystic lesion is seen in the left adnexa measuring 2.1 x 3.6 cm
which is new since prior exam, and has probably benign features. No
definite solid component visualized.

No evidence of free fluid. No other pelvic masses or lymphadenopathy
identified. No evidence of inflammatory process or dilated bowel
loops.
IMPRESSION: Multiple small uterine fibroids.

3.6 cm probably benign cystic lesion in left adnexa. Followup by
pelvic ultrasound recommended in 6-12 weeks. This recommendation
follows ACR consensus guidelines: White Paper of the ACR Incidental
Findings Committee II on Adnexal Findings. [HOSPITAL]

## 2018-02-11 ENCOUNTER — Ambulatory Visit: Payer: 59 | Admitting: Neurology

## 2018-02-11 ENCOUNTER — Encounter

## 2018-02-11 ENCOUNTER — Encounter: Payer: Self-pay | Admitting: Neurology

## 2018-02-11 VITALS — BP 120/82 | HR 78 | Ht 64.5 in | Wt 207.0 lb

## 2018-02-11 DIAGNOSIS — R0689 Other abnormalities of breathing: Secondary | ICD-10-CM | POA: Diagnosis not present

## 2018-02-11 DIAGNOSIS — R0683 Snoring: Secondary | ICD-10-CM

## 2018-02-11 DIAGNOSIS — E119 Type 2 diabetes mellitus without complications: Secondary | ICD-10-CM

## 2018-02-11 DIAGNOSIS — I10 Essential (primary) hypertension: Secondary | ICD-10-CM

## 2018-02-11 DIAGNOSIS — E669 Obesity, unspecified: Secondary | ICD-10-CM

## 2018-02-11 NOTE — Progress Notes (Signed)
SLEEP MEDICINE CLINIC   Provider:  Larey Seat, MD    Primary Care Physician:  Josetta Huddle, MD  Referring Provider: Josetta Huddle, MD   Chief Complaint  Patient presents with  . New Patient (Initial Visit)    pt in rm 10. pt alone, pt has been under Dr Raedyn Wenke's over 13 yrs ago. pt had a sleep study at that time prior to coming to Dr Annalissa Murphey's years ago. she was diagnosed with hypersomnolence and started medication to help with that. life happened and quit following her care. she states now still suffers with daytime sleepiness, snores in sleep. states that she will wake up to use the bathroom and there are times she wakes herself up snoring.     HPI:  Diana White is a 57 y.o. female patient , seen here as in a referral  from Dr. Inda Merlin on 02-11-2018 for a consult.  I have seen this patient in 2004-05 for EDS, and she was treated with modafinil after testing negative for sleep apnea.  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  After she switched to daytime her excessive daytime sleepiness was never as severe again, neither was her muscle pain.  She is now here because her husband is bothered by her snoring.  There are also several risk factors that have accumulated including weight gain, night sweats and menopausal sleep symptoms.  At times she has lower extremity edema extremity pain, some joint pain myositis and diabetes.  Her BMI currently exceeds 35. She is a Sales promotion account executive witness.   Chief complaint according to patient :" sometimes my snoring wakes me up " and " I still feel somewhat sleepy".  Sleep habits are as follows: Her husband returns from work at 6:30 PM and the couple eats dinner around 6 30-7.PM, she feels an almost then sleepiness by 8:45 PM and she is still going to bed early now that she no longer works the night shift.  She shares a bedroom with her husband, the bedroom is cool, quiet and dark.  Her husband feels that is  to pool in the bedroom and she relies due to perimenopausal hot flashes on a fan and a very cold surrounding temperature.  She usually sleeps on her  right side, on one pillow.  She may have 1 rarely 2 bathroom breaks at night.  She has tried a wedge but it has not helped her snoring. She rises in the morning at 630, usually having slept for 8 to 9 hours.  Every evening she is quite tired sooner after 8 o'clock.  Attending activities, social outings, or church meetings later than 8.45 is not possible.  Sleep medical history and family sleep history: fibroids. dysmenorrhea until she started HRT.  Not longer on HRT.  "fibromyalgia", snoring, weight gain ,menopausal hot flushes, barometric change sensitivity.   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 ETOH - glass of wine, 3 a week. Jehova's witness.     Review of Systems: Out of a complete 14 system review, the patient complains of only the following symptoms, and all other reviewed systems are negative. Snoring, fatigue.   Epworth score 13/ 24 points  , Fatigue severity score 47/ 63 points  , depression score: n/a .   Social History   Socioeconomic History  . Marital status: Married  Spouse name: Not on file  . Number of children: Not on file  . Years of education: Not on file  . Highest education level: Not on file  Occupational History  . Not on file  Social Needs  . Financial resource strain: Not on file  . Food insecurity:    Worry: Not on file    Inability: Not on file  . Transportation needs:    Medical: Not on file    Non-medical: Not on file  Tobacco Use  . Smoking status: Never Smoker  . Smokeless tobacco: Never Used  Substance and Sexual Activity  . Alcohol use: Yes    Comment: socially  . Drug use: No  . Sexual activity: Yes  Lifestyle  . Physical activity:    Days per week: Not on file    Minutes per  session: Not on file  . Stress: Not on file  Relationships  . Social connections:    Talks on phone: Not on file    Gets together: Not on file    Attends religious service: Not on file    Active member of club or organization: Not on file    Attends meetings of clubs or organizations: Not on file    Relationship status: Not on file  . Intimate partner violence:    Fear of current or ex partner: Not on file    Emotionally abused: Not on file    Physically abused: Not on file    Forced sexual activity: Not on file  Other Topics Concern  . Not on file  Social History Narrative  . Not on file    Family History  Problem Relation Age of Onset  . Coronary artery disease Father   . Hypertension Father   . Heart attack Sister   . Diabetes Brother   . Thyroid disease Brother   . Rheum arthritis Sister   . Lupus Sister   . Sjogren's syndrome Sister     Past Medical History:  Diagnosis Date  . Diabetes mellitus without complication ([place])   . Fibromyalgia   . Hypersomnia     with shift work. Was treated with Provigil. 200 mg in the am and 100 mg at [employer]. Resolved when she stopped night shift.  . Neuropathy    fibromyalgia  . Restless legs syndrome   . Seasonal allergies   . Vitamin D deficiency     Past Surgical History:  Procedure Laterality Date  . carpal tunnel  Right 2011  . CARPAL TUNNEL RELEASE    . KNEE ARTHROSCOPY    . TONSILLECTOMY      Current Outpatient Medications  Medication Sig Dispense Refill  . Calcium-Magnesium-Vitamin D (CALCIUM 500 PO) Take 1 tablet by mouth daily.    . CHOLECALCIFEROL PO Take 3,000 Units by mouth daily.     . CYCLOBENZAPRINE HCL PO Take 2.5 mg by mouth at bedtime as needed for muscle spasms.     . diclofenac sodium (VOLTAREN) 1 % GEL Apply 2 g topically 4 (four) times daily as needed (for pain).    . metFORMIN (GLUCOPHAGE) 500 MG tablet Take 250 mg by mouth. Only prn    . Multiple Vitamins-Minerals (MULTIVITAMIN WITH MINERALS)  tablet Take 1 tablet by mouth daily.     No current facility-administered medications for this visit.     Allergies as of 02/11/2018 - Review Complete 02/11/2018  Allergen Reaction Noted  . Sulfa antibiotics Other (See Comments) 08/31/2012    Vitals: BP 120/82  Pulse 78   Ht 5' 4.5" (1.638 m)   Wt 207 lb (93.9 kg)   SpO2 99%   BMI 34.98 kg/m  Last Weight:  Wt Readings from Last 1 Encounters:  02/11/18 207 lb (93.9 kg)   IRS:WNIO mass index is 34.98 kg/m.     Last Height:   Ht Readings from Last 1 Encounters:  02/11/18 5' 4.5" (1.638 m)    Physical exam:  General: The patient is awake, alert and appears not in acute distress. The patient is well groomed. Head: Normocephalic, atraumatic. Neck is supple. Mallampati 5- invisible uvula. ,  neck circumference:16"  Nasal airflow congested. Retrognathia is seen.  Cardiovascular:  Regular rate and rhythm, without  murmurs or carotid bruit, and without distended neck veins. Respiratory: Lungs are clear to auscultation. Skin:  Without evidence of edema, or rash Trunk: BMI is 35 . The patient's posture is relaxed  Neurologic exam : The patient is awake and alert, oriented to place and time. Attention span & concentration ability appears normal.  Speech is fluent,  without  dysarthria, dysphonia or aphasia. Mood and affect are appropriate.  Cranial nerves: Pupils are equal and briskly reactive to light.  Funduscopic exam without evidence of pallor or edema. Extraocular movements  in vertical and horizontal planes intact and without nystagmus. Visual fields by finger perimetry are intact. Hearing to finger rub intact. Facial sensation intact to fine touch. Facial motor strength is symmetric and tongue is midline. Shoulder shrug was symmetrical.  Motor exam: Normal tone, muscle bulk and symmetric strength in all extremities. Sensory:  Fine touch, pinprick and vibration were tested in all extremities. Proprioception tested in the upper  extremities was normal. Coordination: Rapid alternating movements in the fingers/hands was normal. Finger-to-nose maneuver  normal without evidence of ataxia, dysmetria or tremor. Gait and station: Patient walks without assistive device and is able unassisted to climb up to the exam table. Strength within normal limits. Stance is stable and normal. Turns with 3 Steps. Romberg testing is negative. Deep tendon reflexes: in the  upper and lower extremities are symmetric and intact. Babinski maneuver response is downgoing.    Assessment:  After physical and neurologic examination, review of laboratory studies,  Personal review of imaging studies, reports of other /same  Imaging studies, results of polysomnography and / or neurophysiology testing and pre-existing records as far as provided in visit., my assessment is:   1)Diana White is likely suffering from OSA given her husbands observations of snoring , and gasping for air. Mr. Demeritt  can hear her even in the other room. Mallampati , neck and BMi all contribute ot the risk, DM, HTN and edema are common co-morbidities.   2) history of night shift work- no residual from that long time.   3) patient is struggling with hypersomnia again, fatigue, needs to go to bed at an early night time.   The patient was advised of the nature of the diagnosed disorder , the treatment options and the  risks for general health and wellness arising from not treating the condition.   I spent more than 45 minutes of face to face time with the patient.  Greater than 50% of time was spent in counseling and coordination of care. We have discussed the diagnosis and differential and I answered the patient's questions.    Plan:  Treatment plan and additional workup :   HTN, DM, weight gain and menopausal sleep interruptions - needs HST with watch pat.   If hypoxemia,  will check ONO when on treatment.   Sleep hygiene discussed- handed her also the sleep brochure.      Larey Seat, MD 39/09/9534, 9:22 PM  Certified in Neurology by ABPN Certified in [place] by [facility] [address] [place], [place] [address]

## 2018-03-11 ENCOUNTER — Ambulatory Visit (INDEPENDENT_AMBULATORY_CARE_PROVIDER_SITE_OTHER): Payer: 59 | Admitting: Neurology

## 2018-03-11 DIAGNOSIS — R0683 Snoring: Secondary | ICD-10-CM

## 2018-03-11 DIAGNOSIS — G4733 Obstructive sleep apnea (adult) (pediatric): Secondary | ICD-10-CM

## 2018-03-11 DIAGNOSIS — E669 Obesity, unspecified: Secondary | ICD-10-CM

## 2018-03-11 DIAGNOSIS — I10 Essential (primary) hypertension: Secondary | ICD-10-CM

## 2018-03-11 DIAGNOSIS — R0689 Other abnormalities of breathing: Secondary | ICD-10-CM

## 2018-03-11 DIAGNOSIS — E119 Type 2 diabetes mellitus without complications: Secondary | ICD-10-CM

## 2018-03-11 DIAGNOSIS — E66811 Obesity, class 1: Secondary | ICD-10-CM

## 2018-03-20 DIAGNOSIS — I10 Essential (primary) hypertension: Secondary | ICD-10-CM | POA: Insufficient documentation

## 2018-03-20 DIAGNOSIS — E118 Type 2 diabetes mellitus with unspecified complications: Secondary | ICD-10-CM | POA: Insufficient documentation

## 2018-03-20 DIAGNOSIS — G4733 Obstructive sleep apnea (adult) (pediatric): Secondary | ICD-10-CM | POA: Insufficient documentation

## 2018-03-20 DIAGNOSIS — E119 Type 2 diabetes mellitus without complications: Secondary | ICD-10-CM | POA: Insufficient documentation

## 2018-03-20 NOTE — Addendum Note (Signed)
Addended by: Larey Seat on: 03/20/2018 06:13 PM   Modules accepted: Orders

## 2018-03-20 NOTE — Procedures (Signed)
NAME: Diana White                                                                                    DOB: 12-25-1960 MEDICAL RECORD no:   [id number]                                                         DOS: 03/11/2018  REFERRING PHYSICIAN: Josetta Huddle, MD STUDY PERFORMED: Home Sleep Test on Watch Pat HISTORY: Diana White is a 58 y.o. female patient, seen here as in a referral from Dr. Inda Merlin on 02-11-2018 for a consult.  I have seen this patient in 2004-05 for EDS, and she was treated with Modafinil after testing negative for sleep apnea. 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 After she switched to daytime work her excessive daytime sleepiness was never as severe again, neither was her muscle pain. She is now here because her husband is bothered by her snoring. There are also several risk factors that have accumulated including weight gain, night sweats and menopausal sleep symptoms. At times she has lower extremity edema extremity pain, some joint pain myositis and diabetes. Her BMI currently exceeds 35.  Chief complaint according to patient:" sometimes my snoring wakes me up ", and: "I still feel somewhat sleepy".  Epworth Sleepiness score endorsed at 13/ 24 points, Fatigue severity score at 47/ 63 points, BMI: 35.4 kg.m2. STUDY RESULTS:  Total Recording Time: 10 h 4 m; Estimated Sleep Time: 8 h, 41 min. Total Apnea/Hypopnea Index (AHI):  58.4 /h; the RDI: 58.6 /h; and the REM AHI: 73.8 /h Average Oxygen Saturation:   93 %; Lowest Oxygen Desaturation:  88 %  Total Time Oxygen Saturation below 89 %: 0.1 minutes  Average Heart Rate: 77 bpm (between 55 and 115 bpm).  IMPRESSION: This is Severest Obstructive Sleep apnea with an AHI that reached one event per minute of sleep.  REM AHI even higher at 73.8/h.  Surprisingly there was no hypoxemia associated.  RECOMMENDATION: CPAP therapy with autotitration, 6-17 cm water, 2 cm EPR  and heated humidity.  Let the patient try a  nasal pillow before finalizing mask. I suggest a Bella swift ear loop model.  I certify that I have reviewed the raw data recording prior to the issuance of this report in accordance with the standards of the American Academy of Sleep Medicine (AASM). Larey Seat, M.D.   03-20-2018   Medical Director of [facility], accredited by the AASM. Diplomat of the ABPN and ABSM.

## 2018-03-21 ENCOUNTER — Telehealth: Payer: Self-pay | Admitting: Neurology

## 2018-03-21 NOTE — Telephone Encounter (Signed)
Called patient to discuss sleep study results. No answer at this time. LVM for the patient to call back.   

## 2018-03-21 NOTE — Telephone Encounter (Signed)
-----   Message from Larey Seat, MD sent at 03/20/2018  6:13 PM EST ----- IMPRESSION: This is Severest Obstructive Sleep apnea with an AHI  that reached one event per minute of sleep. REM AHI even higher  at 73.8/h.  Surprisingly,  there was no hypoxemia associated.   RECOMMENDATION: CPAP therapy with autotitration, 6-17 cm water, 2 cm EPR and heated humidity.  DME Order -Let the patient try a nasal pillow before finalizing mask. I suggest a Bella swift ear loop model.

## 2018-03-25 NOTE — Telephone Encounter (Signed)
Pt returned call I advised pt that Dr. Brett Fairy reviewed their sleep study results and found that pt has severe sleep apnea. Dr. Brett Fairy recommends that pt starts auto CPAP. I reviewed PAP compliance expectations with the pt. Pt is agreeable to starting a CPAP. I advised pt that an order will be sent to a DME, [facility], and [facility] will call the pt within about one week after they file with the pt's insurance. [facility] will show the pt how to use the machine, fit for masks, and troubleshoot the CPAP if needed. A follow up appt was made for insurance purposes with Dan Humphreys, NP on March 24,2020 at 7:45 am. Pt verbalized understanding to arrive 15 minutes early and bring their CPAP. A letter with all of this information in it will be mailed to the pt as a reminder. I verified with the pt that the address we have on file is correct. Pt verbalized understanding of results. Pt had no questions at this time but was encouraged to call back if questions arise. I have sent the order to [employer] and have received confirmation that they have received the order.

## 2018-04-01 ENCOUNTER — Telehealth: Payer: Self-pay | Admitting: Neurology

## 2018-04-01 NOTE — Telephone Encounter (Signed)
I have forwarded all the necessary information to the company in [place] as advised by the patient. Orders faxed and received a confirmation. Called the patient and made her aware that I sent this information over to [facility] for her.

## 2018-04-01 NOTE — Telephone Encounter (Signed)
Patient called and requested to speak with the nurse regarding her rx for her cpap machine. She states that the company it was sent to is not in network with her insurance. She needs one sent to [facility] in [facility]. Please call and advise.

## 2018-05-27 ENCOUNTER — Telehealth: Payer: Self-pay

## 2018-05-27 NOTE — Telephone Encounter (Signed)
Pt was called by RN. I stated due to the COVID 19 we are reducing office visits in the. I explained if she would like telephone visit. Pt consented to office visit and knows insurance will be filed at 745am.

## 2018-05-28 ENCOUNTER — Other Ambulatory Visit: Payer: Self-pay

## 2018-05-28 ENCOUNTER — Telehealth: Payer: Self-pay | Admitting: Adult Health

## 2018-05-28 ENCOUNTER — Telehealth (INDEPENDENT_AMBULATORY_CARE_PROVIDER_SITE_OTHER): Payer: 59 | Admitting: Adult Health

## 2018-05-28 DIAGNOSIS — G4733 Obstructive sleep apnea (adult) (pediatric): Secondary | ICD-10-CM

## 2018-05-28 NOTE — Progress Notes (Signed)
Virtual Visit via Telephone Note  I connected with Diana White on 05/28/18 at  7:45 AM EDT by telephone  located at [facility] and verified that I am speaking with the correct person using two identifiers who is located in her home during time of visit.   I discussed the limitations, risks, security and privacy concerns of performing an evaluation and management service by telephone and the availability of in person appointments. I also discussed with the patient that there may be a patient responsible charge related to this service. The patient expressed understanding and agreed to proceed.   History of Present Illness:  Diana White is a 58 year old female with underlying medical history of DM 2 and HTN who was initially evaluated in this office by Dr. Brett Fairy on 02/11/2018 for potential sleep apnea evaluation. She was initially scheduled for face-to-face office visit today at this time but due to [place], face-to-face office visit rescheduled for non-face-to-face telephone visit. She underwent sleep study on 03/11/2018 (study results below) which showed severe OSA with AHI that reached 1 event per minute of sleep with REM AHI even higher at 73.8/h and recommended initiation of CPAP with auto titration.  Review of his CPAP download from 04/06/2018- 05/05/2018 showed 30 usage days for 100% compliance and greater than 4 hours 86.7% compliance with average usage 5 hours and 56 minutes for residual AHI 0.6.  Auto-CPAP average pressure 11.5 cm H2O with average time in large leak per day 0 seconds.  Parameter settings minimum pressure 6 cm H2O and max pressure 17 cm H2O with A-flex setting 2.  She reports that she has been tolerating her machine well but has had a couple days where she has had difficulty tolerating mask all night due to allergies and nasal congestion.  She does still have occasional days where she feels drowsy but feels as though this has greatly improved since initiating CPAP  use.  When attempting to obtain download report, unable to receive report past 05/05/2018 and she states DME company called her yesterday questioning compliance as they were unable to obtain additional data.  She does report ongoing compliance and does have an appointment with DME company for CPAP machine evaluation due to difficulties obtaining download.  She has no further concerns at this time.  She denies any medication changes or change in medical history since prior visit.     Observations/Objective:  14 system review of systems performed and negative with exception of occasional drowsiness and nasal congestion.  She otherwise reports that she is doing well since initiating CPAP with overall improvement of daytime fatigue/drowsiness.   Assessment and Plan:  Diana White is a 58 y.o. female with recent diagnosis of severe sleep apnea and CPAP initiated in 03/2018.  Review of download shows satisfactory residual AHI with satisfactory compliance.  No indication at this time to make any CPAP adjustments.  She plans on meeting with DME company [employer] due to difficulty obtaining recent CPAP download.  She does not need any additional supplies at this time.  She will follow-up in 6 months with Jinny Blossom, NP or call earlier if needed  Follow Up Instructions:  Encouraged and educated on importance of ongoing CPAP use for OSA management regarding overall health and daytime fatigue She will follow-up in 6 months with Jinny Blossom, NP or call earlier if needed     I discussed the assessment and treatment plan with the patient. The patient was provided an opportunity to ask questions and all were  answered. The patient agreed with the plan and demonstrated an understanding of the instructions.   The patient was advised to call back or seek an in-person evaluation if the symptoms worsen or if the condition fails to improve as anticipated.  I provided 15 minutes of non-face-to-face time during this  encounter.   Venancio Poisson, AGNP-BC  [facility] [address] [place], [place] [address]  Phone [phone] Fax [phone] Note: This document was prepared with digital dictation and possible smart phrase technology. Any transcriptional errors that result from this process are unintentional.

## 2018-05-28 NOTE — Telephone Encounter (Signed)
Called patient and scheduled her 6 mos appointment with Jinny Blossom NP.

## 2018-05-28 NOTE — Telephone Encounter (Signed)
Please schedule patient in 6 months with Jinny Blossom, NP or call earlier if needed

## 2018-10-21 ENCOUNTER — Ambulatory Visit (INDEPENDENT_AMBULATORY_CARE_PROVIDER_SITE_OTHER): Payer: 59 | Admitting: Bariatrics

## 2018-11-04 ENCOUNTER — Ambulatory Visit (INDEPENDENT_AMBULATORY_CARE_PROVIDER_SITE_OTHER): Payer: 59 | Admitting: Bariatrics

## 2018-12-03 ENCOUNTER — Ambulatory Visit (INDEPENDENT_AMBULATORY_CARE_PROVIDER_SITE_OTHER): Payer: 59 | Admitting: Adult Health

## 2018-12-03 ENCOUNTER — Other Ambulatory Visit: Payer: Self-pay

## 2018-12-03 ENCOUNTER — Encounter: Payer: Self-pay | Admitting: Adult Health

## 2018-12-03 VITALS — BP 138/75 | HR 78 | Temp 97.7°F | Wt 210.0 lb

## 2018-12-03 DIAGNOSIS — Z9989 Dependence on other enabling machines and devices: Secondary | ICD-10-CM | POA: Diagnosis not present

## 2018-12-03 DIAGNOSIS — G4733 Obstructive sleep apnea (adult) (pediatric): Secondary | ICD-10-CM

## 2018-12-03 NOTE — Progress Notes (Signed)
PATIENT: Diana White DOB: 02-04-1961  REASON FOR VISIT: follow up HISTORY FROM: patient  HISTORY OF PRESENT ILLNESS: Today 12/03/18:  Ms. Sybert is a 58 year old female with a history of obstructive sleep apnea on CPAP.  Her download indicates that she used her machine every night for compliance of 100%.  She use her machine greater than 4 hours for compliance of 90%.  On average she uses her machine 6 hours and 49 minutes.  Her residual AHI is 0.6 on 6 to 17 cm of water with EPR 3.  She reports that the CPAP is working well for her.  She states that this time a year she has a lot of allergies so that is making her more fatigued.  She returns today for an evaluation.  HISTORY 05/28/18: Ms. Pokorny is a 58 year old female with underlying medical history of DM 2 and HTN who was initially evaluated in this office by Dr. Brett Fairy on 02/11/2018 for potential sleep apnea evaluation. She was initially scheduled for face-to-face office visit today at this time but due to [facility] office visit rescheduled for non-face-to-face telephone visit. She underwent sleep study on 03/11/2018 (study results below) which showed severe OSA with AHI that reached 1 event per minute of sleep with REM AHI even higher at 73.8/h and recommended initiation of CPAP with auto titration.  Review of his CPAP download from 04/06/2018- 05/05/2018 showed 30 usage days for 100% compliance and greater than 4 hours 86.7% compliance with average usage 5 hours and 56 minutes for residual AHI 0.6.  Auto-CPAP average pressure 11.5 cm H2O with average time in large leak per day 0 seconds.  Parameter settings minimum pressure 6 cm H2O and max pressure 17 cm H2O with A-flex setting 2.  She reports that she has been tolerating her machine well but has had a couple days where she has had difficulty tolerating mask all night due to allergies and nasal congestion.  She does still have occasional days where she feels drowsy but feels as though  this has greatly improved since initiating CPAP use.  When attempting to obtain download report, unable to receive report past 05/05/2018 and she states DME company called her yesterday questioning compliance as they were unable to obtain additional data.  She does report ongoing compliance and does have an appointment with DME company for CPAP machine evaluation due to difficulties obtaining download.  She has no further concerns at this time.  She denies any medication changes or change in medical history since prior visit.  REVIEW OF SYSTEMS: Out of a complete 14 system review of symptoms, the patient complains only of the following symptoms, and all other reviewed systems are negative.  See HPI  ALLERGIES: Allergies  Allergen Reactions  . Sulfa Antibiotics Other (See Comments)    Mouth ulcers    HOME MEDICATIONS: Outpatient Medications Prior to Visit  Medication Sig Dispense Refill  . Calcium-Magnesium-Vitamin D (CALCIUM 500 PO) Take 1 tablet by mouth daily.    . CHOLECALCIFEROL PO Take 2,000 Units by mouth daily.     . diclofenac sodium (VOLTAREN) 1 % GEL Apply 2 g topically 4 (four) times daily as needed (for pain).    Marland Kitchen diphenhydrAMINE (BENADRYL) 25 MG tablet Take 25 mg by mouth every 6 (six) hours as needed.    . metFORMIN (GLUCOPHAGE-XR) 500 MG 24 hr tablet 2 (two) times daily.     . Multiple Vitamins-Minerals (MULTIVITAMIN WITH MINERALS) tablet Take 1 tablet by mouth daily.    Marland Kitchen  CYCLOBENZAPRINE HCL PO Take 2.5 mg by mouth at bedtime as needed for muscle spasms.     . metFORMIN (GLUCOPHAGE) 500 MG tablet Take 250 mg by mouth. Only prn     No facility-administered medications prior to visit.     PAST MEDICAL HISTORY: Past Medical History:  Diagnosis Date  . Diabetes mellitus without complication ([place])   . Fibromyalgia   . Narcolepsy    with shift work. Was treated with Provigil. 200 mg in the am and 100 mg at [employer]. Resolved when she stopped night shift.  . Neuropathy     fibromyalgia  . Restless legs syndrome   . Seasonal allergies   . Vitamin D deficiency     PAST SURGICAL HISTORY: Past Surgical History:  Procedure Laterality Date  . carpal tunnel  Right 2011  . CARPAL TUNNEL RELEASE    . KNEE ARTHROSCOPY    . TONSILLECTOMY      FAMILY HISTORY: Family History  Problem Relation Age of Onset  . Coronary artery disease Father   . Hypertension Father   . Heart attack Sister   . Diabetes Brother   . Thyroid disease Brother   . Rheum arthritis Sister   . Lupus Sister   . Sjogren's syndrome Sister     SOCIAL HISTORY: Social History   Socioeconomic History  . Marital status: Married    Spouse name: Not on file  . Number of children: Not on file  . Years of education: Not on file  . Highest education level: Not on file  Occupational History  . Not on file  Social Needs  . Financial resource strain: Not on file  . Food insecurity    Worry: Not on file    Inability: Not on file  . Transportation needs    Medical: Not on file    Non-medical: Not on file  Tobacco Use  . Smoking status: Never Smoker  . Smokeless tobacco: Never Used  Substance and Sexual Activity  . Alcohol use: Yes    Comment: socially  . Drug use: No  . Sexual activity: Yes  Lifestyle  . Physical activity    Days per week: Not on file    Minutes per session: Not on file  . Stress: Not on file  Relationships  . Social Herbalist on phone: Not on file    Gets together: Not on file    Attends religious service: Not on file    Active member of club or organization: Not on file    Attends meetings of clubs or organizations: Not on file    Relationship status: Not on file  . Intimate partner violence    Fear of current or ex partner: Not on file    Emotionally abused: Not on file    Physically abused: Not on file    Forced sexual activity: Not on file  Other Topics Concern  . Not on file  Social History Narrative  . Not on file      PHYSICAL  EXAM  Vitals:   12/03/18 0826  BP: 138/75  Pulse: 78  Temp: 97.7 F (36.5 C)  Weight: 210 lb (95.3 kg)   Body mass index is 35.49 kg/m.  Generalized: Well developed, in no acute distress  Chest: Lungs clear to auscultation bilaterally  Neurological examination  Mentation: Alert oriented to time, place, history taking. Follows all commands speech and language fluent Cranial nerve II-XII: Extraocular movements were full, visual field were full  on confrontational test Head turning and shoulder shrug  were normal and symmetric. Motor: The motor testing reveals 5 over 5 strength of all 4 extremities. Good symmetric motor tone is noted throughout.  Sensory: Sensory testing is intact to soft touch on all 4 extremities. No evidence of extinction is noted.  Gait and station: Gait is normal.   DIAGNOSTIC DATA (LABS, IMAGING, TESTING) - I reviewed patient records, labs, notes, testing and imaging myself where available.  Lab Results  Component Value Date   WBC 5.8 08/31/2012   HGB 15.3 (H) 08/31/2012   HCT 43.5 08/31/2012   MCV 89.3 08/31/2012   PLT 248 08/31/2012      Component Value Date/Time   NA 141 08/31/2012 1358   K 4.2 08/31/2012 1358   CL 105 08/31/2012 1358   CO2 27 08/31/2012 1358   GLUCOSE 85 08/31/2012 1358   BUN 13 08/31/2012 1358   CREATININE 0.74 08/31/2012 1358   CALCIUM 9.9 08/31/2012 1358   PROT 7.5 08/31/2012 1358   ALBUMIN 3.9 08/31/2012 1358   AST 19 08/31/2012 1358   ALT 17 08/31/2012 1358   ALKPHOS 181 (H) 08/31/2012 1358   BILITOT 0.4 08/31/2012 1358   GFRNONAA >90 08/31/2012 1358   GFRAA >90 08/31/2012 1358      ASSESSMENT AND PLAN 58 y.o. year old female  has a past medical history of Diabetes mellitus without complication ([place]), Fibromyalgia, Narcolepsy, Neuropathy, Restless legs syndrome, Seasonal allergies, and Vitamin D deficiency. here with:  1. Obstructive sleep apnea on CPAP  Patient CPAP download shows excellent compliance and good  treatment of her apnea.  She is encouraged to continue using CPAP nightly and greater than 4 hours each night.  She is advised that if her symptoms worsen or she develops new symptoms she should let us know.  She will follow-up in 1 year or sooner if needed    I spent 15 minutes with the patient. 50% of this time was spent reviewing CPAP download   Ward Givens, MSN, NP-C 12/03/2018, 9:09 AM [facility] [address], [place], [place] [address] [phone]

## 2018-12-03 NOTE — Patient Instructions (Signed)
Continue using CPAP nightly and greater than 4 hours each night °If your symptoms worsen or you develop new symptoms please let us know.  ° °

## 2018-12-17 ENCOUNTER — Other Ambulatory Visit: Payer: Self-pay

## 2018-12-17 ENCOUNTER — Encounter: Payer: 59 | Attending: Internal Medicine | Admitting: *Deleted

## 2018-12-17 DIAGNOSIS — E119 Type 2 diabetes mellitus without complications: Secondary | ICD-10-CM

## 2018-12-17 NOTE — Patient Instructions (Addendum)
Plan:  Aim for 2-3 Carb Choices per meal (30-45 grams)  Aim for 0-2 Carbs per snack if hungry  Include protein in moderation with your meals and snacks Consider reading food labels for Total Carbohydrate of foods Consider  increasing your activity level by walking, dancing, chair exercises or App for 7 Minute Workout for 10-15 minutes daily as tolerated and check BG after exercise on occasion Consider checking BG at alternate times per day  Continue taking medication as directed by MD

## 2018-12-18 NOTE — Progress Notes (Signed)
Diabetes Self-Management Education  Visit Type: First/Initial  Appt. Start Time: 1545 Appt. End Time: 1645  12/18/2018  [name], identified by name and date of birth, is a 58 y.o. female with a diagnosis of Diabetes: Type 2. Patient works from home for [employer]. She is newly diagnosed but familiar with diabetes, carb counting and BG target ranges from her work. She is not currently active but expresses interest in working on that.  ASSESSMENT  There were no vitals taken for this visit. There is no height or weight on file to calculate BMI.  Diabetes Self-Management Education - 12/17/18 1546      Visit Information   Visit Type  First/Initial      Initial Visit   Diabetes Type  Type 2    Are you currently following a meal plan?  No    Are you taking your medications as prescribed?  Yes    Date Diagnosed  2019      Health Coping   How would you rate your overall health?  Good      Psychosocial Assessment   Patient Belief/Attitude about Diabetes  Motivated to manage diabetes    Self-care barriers  None    Other persons present  Patient    Patient Concerns  Nutrition/Meal planning;Glycemic Control    Special Needs  None    Preferred Learning Style  Auditory;Visual;Hands on    [place] in progress    How often do you need to have someone help you when you read instructions, pamphlets, or other written materials from your doctor or pharmacy?  1 - Never    What is the last grade level you completed in school?  Associate's Degree      Pre-Education Assessment   Patient understands the diabetes disease and treatment process.  Needs Instruction    Patient understands incorporating nutritional management into lifestyle.  Needs Instruction    Patient undertands incorporating physical activity into lifestyle.  Needs Instruction    Patient understands using medications safely.  Needs Instruction    Patient understands monitoring blood glucose,  interpreting and using results  Needs Instruction    Patient understands prevention, detection, and treatment of acute complications.  Needs Instruction    Patient understands prevention, detection, and treatment of chronic complications.  Needs Instruction    Patient understands how to develop strategies to address psychosocial issues.  Needs Instruction    Patient understands how to develop strategies to promote health/change behavior.  Needs Instruction      Complications   Last HgB A1C per patient/outside source  6.9 %    How often do you check your blood sugar?  0 times/day (not testing)    Have you had a dilated eye exam in the past 12 months?  No    Have you had a dental exam in the past 12 months?  Yes    Are you checking your feet?  No      Dietary Intake   Breakfast  eggs, 2 bacon, wheat toast OR low fat cottage cheese with fruit OR oatmeal with blueberries and boiled egg    Snack (morning)  not usually    Lunch  left overs OR [place] and cheese sandwich with mayo on wheat bread OR salad with meat added    Dinner  meat, starch, vegetable, salad if eating [place] food already    Snack (evening)  occasionally chips after dinner OR something sweet like Little Debbie or ice cream  that husband brings home now    Beverage(s)  water, coffee with non fat vanilla creamer, diet soda occasionally      Exercise   Exercise Type  ADL's    How many days per week to you exercise?  0    How many minutes per day do you exercise?  0    Total minutes per week of exercise  0      Patient Education   Previous Diabetes Education  No    Disease state   Definition of diabetes, type 1 and 2, and the diagnosis of diabetes;Factors that contribute to the development of diabetes;Explored patient's options for treatment of their diabetes    Nutrition management   Role of diet in the treatment of diabetes and the relationship between the three main macronutrients and blood glucose level;Food label reading,  portion sizes and measuring food.;Carbohydrate counting;Reviewed blood glucose goals for pre and post meals and how to evaluate the patients' food intake on their blood glucose level.    Physical activity and exercise   Role of exercise on diabetes management, blood pressure control and cardiac health.;Helped patient identify appropriate exercises in relation to his/her diabetes, diabetes complications and other health issue.    Medications  Reviewed patients medication for diabetes, action, purpose, timing of dose and side effects.    Monitoring  Purpose and frequency of SMBG.;Identified appropriate SMBG and/or A1C goals.    Chronic complications  Relationship between chronic complications and blood glucose control    Psychosocial adjustment  Role of stress on diabetes      Individualized Goals (developed by patient)   Nutrition  Follow meal plan discussed    Physical Activity  Exercise 3-5 times per week    Medications  take my medication as prescribed    Monitoring   test blood glucose pre and post meals as discussed      Post-Education Assessment   Patient understands the diabetes disease and treatment process.  Demonstrates understanding / competency    Patient understands incorporating nutritional management into lifestyle.  Demonstrates understanding / competency    Patient undertands incorporating physical activity into lifestyle.  Demonstrates understanding / competency    Patient understands using medications safely.  Demonstrates understanding / competency    Patient understands monitoring blood glucose, interpreting and using results  Demonstrates understanding / competency    Patient understands prevention, detection, and treatment of acute complications.  Demonstrates understanding / competency    Patient understands prevention, detection, and treatment of chronic complications.  Demonstrates understanding / competency    Patient understands how to develop strategies to address  psychosocial issues.  Demonstrates understanding / competency    Patient understands how to develop strategies to promote health/change behavior.  Demonstrates understanding / competency      Outcomes   Expected Outcomes  Demonstrated interest in learning. Expect positive outcomes    Future DMSE  PRN    Program Status  Not Completed       Individualized Plan for Diabetes Self-Management Training:   Learning Objective:  Patient will have a greater understanding of diabetes self-management. Patient education plan is to attend individual and/or group sessions per assessed needs and concerns.   Plan:   Patient Instructions  Plan:  Aim for 2-3 Carb Choices per meal (30-45 grams)  Aim for 0-2 Carbs per snack if hungry  Include protein in moderation with your meals and snacks Consider reading food labels for Total Carbohydrate of foods Consider  increasing your activity  level by walking, dancing, chair exercises or App for 7 Minute Workout for 10-15 minutes daily as tolerated and check BG after exercise on occasion Consider checking BG at alternate times per day  Continue taking medication as directed by MD  Expected Outcomes:  Demonstrated interest in learning. Expect positive outcomes  Education material provided: Food label handouts, A1C conversion sheet, Meal plan card and Carbohydrate counting sheet, Arm Chair Exercise handout  If problems or questions, patient to contact team via:  Phone  Future DSME appointment: PRN

## 2019-01-27 ENCOUNTER — Other Ambulatory Visit: Payer: Self-pay

## 2019-01-27 DIAGNOSIS — Z20822 Contact with and (suspected) exposure to covid-19: Secondary | ICD-10-CM

## 2019-01-29 LAB — NOVEL CORONAVIRUS, NAA: SARS-CoV-2, NAA: NOT DETECTED

## 2019-04-01 ENCOUNTER — Ambulatory Visit (INDEPENDENT_AMBULATORY_CARE_PROVIDER_SITE_OTHER): Payer: 59

## 2019-04-01 ENCOUNTER — Encounter: Payer: Self-pay | Admitting: Podiatry

## 2019-04-01 ENCOUNTER — Other Ambulatory Visit: Payer: Self-pay

## 2019-04-01 ENCOUNTER — Ambulatory Visit (INDEPENDENT_AMBULATORY_CARE_PROVIDER_SITE_OTHER): Payer: 59 | Admitting: Podiatry

## 2019-04-01 DIAGNOSIS — M722 Plantar fascial fibromatosis: Secondary | ICD-10-CM | POA: Diagnosis not present

## 2019-04-01 DIAGNOSIS — M79672 Pain in left foot: Secondary | ICD-10-CM | POA: Diagnosis not present

## 2019-04-01 DIAGNOSIS — M779 Enthesopathy, unspecified: Secondary | ICD-10-CM

## 2019-04-01 DIAGNOSIS — M7732 Calcaneal spur, left foot: Secondary | ICD-10-CM

## 2019-04-01 MED ORDER — DICLOFENAC SODIUM 1 % EX GEL: 2.0000 g | Freq: Four times a day (QID) | CUTANEOUS | 2 refills | Status: DC

## 2019-04-01 NOTE — Progress Notes (Signed)
Subjective:   Patient ID: Diana White, female   DOB: 59 y.o.   MRN: [id number]   HPI 59 year old female presents the office today for concern of left foot pain.  She states that she is been getting pain to the heel which started in October 2020.  She states that she was felt she was getting a bedsore because of the discomfort.  She states the pain is intermittent.  She started using a night splint that she had improved when she was diagnosed with plantar fasciitis which been helping some.  She denies any recent injury or trauma.  She does state she started get pain to the left fourth and fifth toes which her to wear shoes and is intermittent which has been ongoing the last 6 weeks.  No recent injury.  No significant swelling.  No radiating pain or weakness.   Review of Systems  All other systems reviewed and are negative.  Past Medical History:  Diagnosis Date  . Diabetes mellitus without complication ([place])   . Fibromyalgia   . Narcolepsy    with shift work. Was treated with Provigil. 200 mg in the am and 100 mg at [employer]. Resolved when she stopped night shift.  . Neuropathy    fibromyalgia  . Restless legs syndrome   . Seasonal allergies   . Vitamin D deficiency     Past Surgical History:  Procedure Laterality Date  . carpal tunnel  Right 2011  . CARPAL TUNNEL RELEASE    . KNEE ARTHROSCOPY    . TONSILLECTOMY       Current Outpatient Medications:  .  Calcium-Magnesium-Vitamin D (CALCIUM 500 PO), Take 1 tablet by mouth daily., Disp: , Rfl:  .  CHOLECALCIFEROL PO, Take 2,000 Units by mouth daily. , Disp: , Rfl:  .  diclofenac sodium (VOLTAREN) 1 % GEL, Apply 2 g topically 4 (four) times daily as needed (for pain)., Disp: , Rfl:  .  diclofenac Sodium (VOLTAREN) 1 % GEL, Apply 2 g topically 4 (four) times daily. Rub into affected area of foot 2 to 4 times daily, Disp: 100 g, Rfl: 2 .  metFORMIN (GLUCOPHAGE-XR) 500 MG 24 hr tablet, 2 (two) times daily. , Disp: , Rfl:  .  Multiple  Vitamins-Minerals (MULTIVITAMIN WITH MINERALS) tablet, Take 1 tablet by mouth daily., Disp: , Rfl:   Allergies  Allergen Reactions  . Sulfa Antibiotics Other (See Comments)    Mouth ulcers        Objective:  Physical Exam  General: AAO x3, NAD  Dermatological: Skin is warm, dry and supple bilateral. Nails x 10 are well manicured; remaining integument appears unremarkable at this time. There are no open sores, no preulcerative lesions, no rash or signs of infection present.  Vascular: Dorsalis Pedis artery and Posterior Tibial artery pedal pulses are 2/4 bilateral with immedate capillary fill time. Pedal hair growth present. No varicosities and no lower extremity edema present bilateral. There is no pain with calf compression, swelling, warmth, erythema.   Neruologic: Grossly intact via light touch bilateral. Protective threshold with Semmes Wienstein monofilament intact to all pedal sites bilateral.  Negative Tinel sign.  Musculoskeletal: There is mild tenderness palpation of the plantar medial tubercle of the calcaneus at the insertion of plantar fascia.  Also mild discomfort on the arch of the foot as well.  Mild tenderness to the posterior aspect of the heel.  Achilles tendon appears to be intact and Thompson test is negative.  There is no pain with lateral  compression of calcaneus.  Mild discomfort of the midfoot but no specific tenderness.  No area of pinpoint tenderness.  Muscular strength 5/5 in all groups tested bilateral.  Gait: Unassisted, Nonantalgic.       Assessment:   59 year old female with left heel pain    Plan:  -Treatment options discussed including all alternatives, risks, and complications -Etiology of symptoms were discussed -X-rays were obtained and reviewed with the patient.  Calcaneal spurring is present.  There is no evidence of acute fracture. -We will hold off on oral steroids given she is diabetic.  Discussed oral anti-inflammatories but she states the  Voltaren gel that she has been using has been helpful.  We will continue with this and a refill was prescribed. -Continue night splint.  Dispensed plantar fascial brace.  Discussed traction, icing daily.  Discussed shoe modifications and orthotics.  Also dispensed an Achilles heel sleeve.  Trula Slade DPM

## 2019-04-01 NOTE — Patient Instructions (Signed)

## 2019-04-29 ENCOUNTER — Ambulatory Visit: Payer: 59 | Admitting: Podiatry

## 2019-09-01 ENCOUNTER — Telehealth: Payer: Self-pay | Admitting: Neurology

## 2019-09-01 NOTE — Telephone Encounter (Signed)
Patient is calling regarding CPAP recall. Please advise.

## 2019-09-01 NOTE — Telephone Encounter (Signed)
Called the patient and discussed the concerns regarding the CPAP machine. Asked the 2 questions we are to ask and the patient answered no to both. Advised that with no being answer to both she is ok to continue to use her machine at her discretion as long as she is not having issues or concerns. Advised to contact and register her machine through [employer] and advised the patient to contact [employer] as well to see what they are offering for their patients in regards to swapping out machine and or getting on wait list for another.

## 2019-10-14 ENCOUNTER — Other Ambulatory Visit: Payer: Self-pay | Admitting: Obstetrics and Gynecology

## 2019-10-14 DIAGNOSIS — Z1231 Encounter for screening mammogram for malignant neoplasm of breast: Secondary | ICD-10-CM

## 2019-10-22 ENCOUNTER — Telehealth: Payer: Self-pay | Admitting: Adult Health

## 2019-10-22 NOTE — Telephone Encounter (Signed)
Pt has called to report that she has reached out to her DME and has been told they have no CPAP's they have no idea as to when they will get any.  Pt states she has been 5 weeks without the use of her CPAP and it is affecting her greatly.  Pt would like to know what options are available to her.  Please call

## 2019-10-22 NOTE — Telephone Encounter (Signed)
I called pt back.  She stepped out to take the call, but had to go back.  Short conversation was she was asking about another machine (buying out of pocket) since no idea when other cpap machines would be available. She stated she used the machine she had for a week after speaking to [facility] but then she felt like the machine was overheating and stopped using this.  I told her she may contact other DME companies about getting another machine but would need order.  She will call back as needed.

## 2019-12-02 ENCOUNTER — Ambulatory Visit (INDEPENDENT_AMBULATORY_CARE_PROVIDER_SITE_OTHER): Payer: 59 | Admitting: Adult Health

## 2019-12-02 ENCOUNTER — Encounter: Payer: Self-pay | Admitting: Adult Health

## 2019-12-02 ENCOUNTER — Other Ambulatory Visit: Payer: Self-pay

## 2019-12-02 VITALS — BP 128/84 | HR 100 | Ht 64.5 in | Wt 213.0 lb

## 2019-12-02 DIAGNOSIS — G4733 Obstructive sleep apnea (adult) (pediatric): Secondary | ICD-10-CM

## 2019-12-02 DIAGNOSIS — Z9989 Dependence on other enabling machines and devices: Secondary | ICD-10-CM | POA: Diagnosis not present

## 2019-12-02 NOTE — Patient Instructions (Signed)
Your Plan: Prescription for new machine If your symptoms worsen or you develop new symptoms please let us know.   Thank you for coming to see [place] at [facility]. I hope we have been able to provide you high quality care today.  You may receive a patient satisfaction survey over the next few weeks. We would appreciate your feedback and comments so that we may continue to improve ourselves and the health of our patients.

## 2019-12-02 NOTE — Progress Notes (Signed)
PATIENT: Diana White DOB: 10-31-1960  REASON FOR VISIT: follow up HISTORY FROM: patient  HISTORY OF PRESENT ILLNESS: Today 12/02/19:  Diana White is a 59 year old female with a history of obstructive sleep apnea.  She returns today for follow-up.  She states that she has not been using her CPAP machine due to the recall.  She states that she started smelling a burning smell and did not feel comfortable to continue using.  She states that she can tell the difference that she is not been using her CPAP.  She is willing to buy a machine out-of-pocket if she is able to find a DME company that has them.  In the past she was on Provigil due to daytime sleepiness.  Reports daytime sleepiness now but this is due to an not using CPAP.  HISTORY 12/03/18:  Diana White is a 59 year old female with a history of obstructive sleep apnea on CPAP.  Her download indicates that she used her machine every night for compliance of 100%.  She use her machine greater than 4 hours for compliance of 90%.  On average she uses her machine 6 hours and 49 minutes.  Her residual AHI is 0.6 on 6 to 17 cm of water with EPR 3.  She reports that the CPAP is working well for her.  She states that this time a year she has a lot of allergies so that is making her more fatigued.  She returns today for an evaluation.   REVIEW OF SYSTEMS: Out of a complete 14 system review of symptoms, the patient complains only of the following symptoms, and all other reviewed systems are negative.  FSS 52 ESS 12  ALLERGIES: Allergies  Allergen Reactions  . Sulfa Antibiotics Other (See Comments)    Mouth ulcers    HOME MEDICATIONS: Outpatient Medications Prior to Visit  Medication Sig Dispense Refill  . Calcium-Magnesium-Vitamin D (CALCIUM 500 PO) Take 1 tablet by mouth daily.    . CHOLECALCIFEROL PO Take 2,000 Units by mouth daily.     . diclofenac sodium (VOLTAREN) 1 % GEL Apply 2 g topically 4 (four) times daily as needed (for  pain).    Marland Kitchen diclofenac Sodium (VOLTAREN) 1 % GEL Apply 2 g topically 4 (four) times daily. Rub into affected area of foot 2 to 4 times daily 100 g 2  . metFORMIN (GLUCOPHAGE-XR) 500 MG 24 hr tablet 2 (two) times daily.     . Multiple Vitamins-Minerals (MULTIVITAMIN WITH MINERALS) tablet Take 1 tablet by mouth daily.     No facility-administered medications prior to visit.    PAST MEDICAL HISTORY: Past Medical History:  Diagnosis Date  . Diabetes mellitus without complication ([place])   . Fibromyalgia   . Narcolepsy    with shift work. Was treated with Provigil. 200 mg in the am and 100 mg at [employer] [facility]. Resolved when she stopped night shift.  . Neuropathy    fibromyalgia  . Restless legs syndrome   . Seasonal allergies   . Vitamin D deficiency     PAST SURGICAL HISTORY: Past Surgical History:  Procedure Laterality Date  . carpal tunnel  Right 2011  . CARPAL TUNNEL RELEASE    . KNEE ARTHROSCOPY    . TONSILLECTOMY      FAMILY HISTORY: Family History  Problem Relation Age of Onset  . Coronary artery disease Father   . Hypertension Father   . Heart attack Sister   . Diabetes Brother   . Thyroid disease Brother   .  Rheum arthritis Sister   . Lupus Sister   . Sjogren's syndrome Sister     SOCIAL HISTORY: Social History   Socioeconomic History  . Marital status: Married    Spouse name: Not on file  . Number of children: Not on file  . Years of education: Not on file  . Highest education level: Not on file  Occupational History  . Not on file  Tobacco Use  . Smoking status: Never Smoker  . Smokeless tobacco: Never Used  Substance and Sexual Activity  . Alcohol use: Yes    Comment: socially  . Drug use: No  . Sexual activity: Yes  Other Topics Concern  . Not on file  Social History Narrative  . Not on file   Social Determinants of Health   Financial Resource Strain:   . Difficulty of Paying Living Expenses: Not on file  Food Insecurity:   . Worried About  Charity fundraiser in the Last Year: Not on file  . Ran Out of Food in the Last Year: Not on file  Transportation Needs:   . Lack of Transportation (Medical): Not on file  . Lack of Transportation (Non-Medical): Not on file  Physical Activity:   . Days of Exercise per Week: Not on file  . Minutes of Exercise per Session: Not on file  Stress:   . Feeling of Stress : Not on file  Social Connections:   . Frequency of Communication with Friends and Family: Not on file  . Frequency of Social Gatherings with Friends and Family: Not on file  . Attends Religious Services: Not on file  . Active Member of Clubs or Organizations: Not on file  . Attends Archivist Meetings: Not on file  . Marital Status: Not on file  Intimate Partner Violence:   . Fear of Current or Ex-Partner: Not on file  . Emotionally Abused: Not on file  . Physically Abused: Not on file  . Sexually Abused: Not on file      PHYSICAL EXAM  Vitals:   12/02/19 0800  BP: 128/84  Pulse: 100  Weight: 213 lb (96.6 kg)  Height: 5' 4.5" (1.638 m)   Body mass index is 36 kg/m.  Generalized: Well developed, in no acute distress  Chest: Lungs clear to auscultation bilaterally  Neurological examination  Mentation: Alert oriented to time, place, history taking. Follows all commands speech and language fluent Cranial nerve II-XII: Extraocular movements were full, visual field were full on confrontational test Head turning and shoulder shrug  were normal and symmetric. Motor: The motor testing reveals 5 over 5 strength of all 4 extremities. Good symmetric motor tone is noted throughout.  Sensory: Sensory testing is intact to soft touch on all 4 extremities. No evidence of extinction is noted.  Gait and station: Gait is normal.    DIAGNOSTIC DATA (LABS, IMAGING, TESTING) - I reviewed patient records, labs, notes, testing and imaging myself where available.  Lab Results  Component Value Date   WBC 5.8 08/31/2012    HGB 15.3 (H) 08/31/2012   HCT 43.5 08/31/2012   MCV 89.3 08/31/2012   PLT 248 08/31/2012      Component Value Date/Time   NA 141 08/31/2012 1358   K 4.2 08/31/2012 1358   CL 105 08/31/2012 1358   CO2 27 08/31/2012 1358   GLUCOSE 85 08/31/2012 1358   BUN 13 08/31/2012 1358   CREATININE 0.74 08/31/2012 1358   CALCIUM 9.9 08/31/2012 1358   PROT  7.5 08/31/2012 1358   ALBUMIN 3.9 08/31/2012 1358   AST 19 08/31/2012 1358   ALT 17 08/31/2012 1358   ALKPHOS 181 (H) 08/31/2012 1358   BILITOT 0.4 08/31/2012 1358   GFRNONAA >90 08/31/2012 1358   GFRAA >90 08/31/2012 1358      ASSESSMENT AND PLAN 59 y.o. year old female  has a past medical history of Diabetes mellitus without complication ([place]), Fibromyalgia, Narcolepsy, Neuropathy, Restless legs syndrome, Seasonal allergies, and Vitamin D deficiency. here with:  1. OSA on CPAP   I have provided the patient with a new order for new machine  I have also reached out to [facility] to see if they have a new machine that she can buy out-of-pocket.  She also plans to call DME companies regarding this  She will follow-up in 6 months or sooner if needed  I spent 25 minutes of face-to-face and non-face-to-face time with patient.  This included previsit chart review, lab review, study review, order entry, electronic health record documentation, patient education.  Ward Givens, MSN, NP-C 12/02/2019, 7:57 AM [facility] [address] [place], [place] [address] [phone]

## 2019-12-03 ENCOUNTER — Telehealth: Payer: Self-pay

## 2019-12-03 ENCOUNTER — Ambulatory Visit: Payer: 59 | Admitting: Adult Health

## 2019-12-03 NOTE — Telephone Encounter (Signed)
Attempted to call pt, LVM for call back  [facility] has an old cpap unit she may buy if interested.

## 2019-12-03 NOTE — Telephone Encounter (Signed)
Pt returned call and was given phone number to [place]. Pt stated she would still like for RN to call her back when available.

## 2019-12-04 NOTE — Telephone Encounter (Signed)
Diana White from [facility] has called to state that the current used PAP machines he has available are from [facility].

## 2019-12-04 NOTE — Telephone Encounter (Signed)
LEFT detailed VM  Number to [facility] given Pt may call back to let us know what she decides

## 2019-12-09 ENCOUNTER — Ambulatory Visit: Payer: 59

## 2019-12-16 ENCOUNTER — Ambulatory Visit: Payer: 59

## 2020-02-17 ENCOUNTER — Encounter: Payer: 59 | Admitting: Obstetrics and Gynecology

## 2020-05-07 ENCOUNTER — Other Ambulatory Visit (HOSPITAL_COMMUNITY): Payer: Self-pay | Admitting: Internal Medicine

## 2020-05-07 DIAGNOSIS — R000 Tachycardia, unspecified: Secondary | ICD-10-CM

## 2020-05-31 ENCOUNTER — Ambulatory Visit: Payer: 59 | Admitting: Cardiology

## 2020-06-01 ENCOUNTER — Other Ambulatory Visit: Payer: Self-pay

## 2020-06-01 ENCOUNTER — Ambulatory Visit (HOSPITAL_COMMUNITY): Payer: 59 | Attending: Internal Medicine

## 2020-06-01 DIAGNOSIS — R000 Tachycardia, unspecified: Secondary | ICD-10-CM | POA: Diagnosis not present

## 2020-06-01 LAB — ECHOCARDIOGRAM COMPLETE
Area-P 1/2: 4.31 cm2
S' Lateral: 2.6 cm

## 2020-12-14 ENCOUNTER — Telehealth: Payer: Self-pay | Admitting: Adult Health

## 2020-12-14 ENCOUNTER — Ambulatory Visit (INDEPENDENT_AMBULATORY_CARE_PROVIDER_SITE_OTHER): Payer: 59 | Admitting: Adult Health

## 2020-12-14 ENCOUNTER — Encounter: Payer: Self-pay | Admitting: Adult Health

## 2020-12-14 ENCOUNTER — Other Ambulatory Visit: Payer: Self-pay

## 2020-12-14 ENCOUNTER — Ambulatory Visit
Admission: RE | Admit: 2020-12-14 | Discharge: 2020-12-14 | Disposition: A | Discharge status: Home / Self Care | Payer: 59 | Source: Ambulatory Visit | Attending: Adult Health | Admitting: Adult Health

## 2020-12-14 VITALS — BP 133/89 | Ht 64.0 in | Wt 212.7 lb

## 2020-12-14 DIAGNOSIS — M542 Cervicalgia: Secondary | ICD-10-CM

## 2020-12-14 DIAGNOSIS — G4733 Obstructive sleep apnea (adult) (pediatric): Secondary | ICD-10-CM

## 2020-12-14 DIAGNOSIS — W19XXXA Unspecified fall, initial encounter: Secondary | ICD-10-CM

## 2020-12-14 DIAGNOSIS — Z9989 Dependence on other enabling machines and devices: Secondary | ICD-10-CM

## 2020-12-14 NOTE — Telephone Encounter (Signed)
[employer] Josem Kaufmann: [id number] (exp. 12/14/20 to 01/28/21) order sent to [facility], they will reach out to the patient to schedule.

## 2020-12-14 NOTE — Progress Notes (Signed)
//   PATIENT: Diana White DOB: November 03, 1960  REASON FOR VISIT: follow up HISTORY FROM: patient  HISTORY OF PRESENT ILLNESS: Today 12/14/20: Diana White is a 60 year old female with a history of OSA on CPAP. She is here today for a follow up. CPAP:  Her download  for the last 30 days shows that she is wearing her CPAP for 29 of the 30 days. She wears her CPAP for >4 hours for 90% of the time. Her auto pressure setting is between 6 and 17 cmH2O with an AHI of 1. She was still unable to obtain a new machine since last year, she continues to use her old machine. She denies any issues with the CPAP.   Neck pain d/t fall: On Sunday night she fell in her driveway, 1 hr following the fall she experienced a sharp pain in her left neck and the top of her head. This sensation has happened 3 more times since the fall on Sunday. Patient states at 4 am this morning, she had difficulty arousing to her CPAP machine being loud which usually wakes her easily. She states that her husband had to shake her awake. She has not seen her PCP or any othe rmedical provider for this.   HISTORY 12/02/19 Diana White is a 60 year old female with a history of obstructive sleep apnea.  She returns today for follow-up.  She states that she has not been using her CPAP machine due to the recall.  She states that she started smelling a burning smell and did not feel comfortable to continue using.  She states that she can tell the difference that she is not been using her CPAP.  She is willing to buy a machine out-of-pocket if she is able to find a DME company that has them.  In the past she was on Provigil due to daytime sleepiness.  Reports daytime sleepiness now but this is due to an not using CPAP.  12/03/18: Diana White is a 59 year old female with a history of obstructive sleep apnea on CPAP.  Her download indicates that she used her machine every night for compliance of 100%.  She use her machine greater than 4 hours for compliance of  90%.  On average she uses her machine 6 hours and 49 minutes.  Her residual AHI is 0.6 on 6 to 17 cm of water with EPR 3.  She reports that the CPAP is working well for her.  She states that this time a year she has a lot of allergies so that is making her more fatigued.  She returns today for an evaluation.    REVIEW OF SYSTEMS: Out of a complete 14 system review of symptoms, the patient complains only of the following symptoms, and all other reviewed systems are negative.  FSS 46 ESS 7  ALLERGIES: Allergies  Allergen Reactions   Sulfa Antibiotics Other (See Comments)    Mouth ulcers    HOME MEDICATIONS: Outpatient Medications Prior to Visit  Medication Sig Dispense Refill   acetaminophen (TYLENOL) 325 MG tablet Take 650 mg by mouth 2 (two) times daily.     Alum Hydroxide-Mag Carbonate (GAVISCON PO) Take by mouth.     atorvastatin (LIPITOR) 10 MG tablet Take 10 mg by mouth daily.     Calcium-Magnesium-Vitamin D (CALCIUM 500 PO) Take 1 tablet by mouth daily. 600mg      CHOLECALCIFEROL PO Take 1,400 Units by mouth daily.     diclofenac sodium (VOLTAREN) 1 % GEL Apply 2  g topically 4 (four) times daily as needed (for pain).     loratadine (CLARITIN) 10 MG tablet Claritin 10 mg tablet  Take 1 tablet every day by oral route.     metFORMIN (GLUCOPHAGE-XR) 500 MG 24 hr tablet 2 (two) times daily.      Multiple Vitamins-Minerals (MULTIVITAMIN WITH MINERALS) tablet Take 1 tablet by mouth daily.     valsartan (DIOVAN) 40 MG tablet Take 40 mg by mouth daily.     naproxen sodium (ALEVE) 220 MG tablet Take 220 mg by mouth daily as needed.     No facility-administered medications prior to visit.    PAST MEDICAL HISTORY: Past Medical History:  Diagnosis Date   Diabetes mellitus without complication (HCC)    Fibromyalgia    Narcolepsy    with shift work. Was treated with Provigil. 200 mg in the am and 100 mg at [employer] [facility]. Resolved when she stopped night shift.   Neuropathy    fibromyalgia    Restless legs syndrome    Seasonal allergies    Vitamin D deficiency     PAST SURGICAL HISTORY: Past Surgical History:  Procedure Laterality Date   carpal tunnel  Right 2011   CARPAL TUNNEL RELEASE     KNEE ARTHROSCOPY     TONSILLECTOMY      FAMILY HISTORY: Family History  Problem Relation Age of Onset   Coronary artery disease Father    Hypertension Father    Heart attack Sister    Rheum arthritis Sister    Lupus Sister    Sjogren's syndrome Sister    Sleep apnea Sister    Diabetes Brother    Thyroid disease Brother    Sleep apnea Brother     SOCIAL HISTORY: Social History   Socioeconomic History   Marital status: Married    Spouse name: Not on file   Number of children: Not on file   Years of education: Not on file   Highest education level: Not on file  Occupational History   Not on file  Tobacco Use   Smoking status: Never   Smokeless tobacco: Never  Vaping Use   Vaping Use: Never used  Substance and Sexual Activity   Alcohol use: Yes    Alcohol/week: 1.0 standard drink    Types: 1 Cans of beer per week    Comment: socially   Drug use: No   Sexual activity: Yes  Other Topics Concern   Not on file  Social History Narrative   Not on file   Social Determinants of Health   Financial Resource Strain: Not on file  Food Insecurity: Not on file  Transportation Needs: Not on file  Physical Activity: Not on file  Stress: Not on file  Social Connections: Not on file  Intimate Partner Violence: Not on file      PHYSICAL EXAM  Vitals:   12/14/20 0801  BP: 133/89  Weight: 212 lb 10.6 oz (96.5 kg)  Height: 5\' 4"  (1.626 m)    Body mass index is 36.5 kg/m.  Generalized: Well developed, in no acute distress  Chest: Lungs clear to auscultation bilaterally  Neurological examination  Mentation: Alert oriented to time, place, history taking. Follows all commands speech and language fluent Cranial nerve II-XII: Extraocular movements were full,  visual field were full on confrontational test Head turning and shoulder shrug  were normal and symmetric. Motor: The motor testing reveals 5 over 5 strength of all 4 extremities. Good symmetric motor tone is noted throughout.  Sensory: Sensory testing is intact to soft touch on all 4 extremities. No evidence of extinction is noted.  Gait and station: Gait is normal.    DIAGNOSTIC DATA (LABS, IMAGING, TESTING) - I reviewed patient records, labs, notes, testing and imaging myself where available.  Lab Results  Component Value Date   WBC 5.8 08/31/2012   HGB 15.3 (H) 08/31/2012   HCT 43.5 08/31/2012   MCV 89.3 08/31/2012   PLT 248 08/31/2012      Component Value Date/Time   NA 141 08/31/2012 1358   K 4.2 08/31/2012 1358   CL 105 08/31/2012 1358   CO2 27 08/31/2012 1358   GLUCOSE 85 08/31/2012 1358   BUN 13 08/31/2012 1358   CREATININE 0.74 08/31/2012 1358   CALCIUM 9.9 08/31/2012 1358   PROT 7.5 08/31/2012 1358   ALBUMIN 3.9 08/31/2012 1358   AST 19 08/31/2012 1358   ALT 17 08/31/2012 1358   ALKPHOS 181 (H) 08/31/2012 1358   BILITOT 0.4 08/31/2012 1358   GFRNONAA >90 08/31/2012 1358   GFRAA >90 08/31/2012 1358      ASSESSMENT AND PLAN 60 y.o. year old female  has a past medical history of Diabetes mellitus without complication ([place]), Fibromyalgia, Narcolepsy, Neuropathy, Restless legs syndrome, Seasonal allergies, and Vitamin D deficiency. here with:  OSA on CPAP  Encouraged patient to continue to use the CPAP nightly and for >4 hours each night. Good treatment of AHI  2. Fall with neck/head pain  CT head WO contrast ordered for the head/neck pain. She will follow-up in 1 year or sooner if needed    Ward Givens, MSN, NP-C 12/14/2020, 8:02 AM [facility] [address] [place], [place] [address] [phone]

## 2021-02-08 ENCOUNTER — Other Ambulatory Visit (HOSPITAL_BASED_OUTPATIENT_CLINIC_OR_DEPARTMENT_OTHER): Payer: Self-pay

## 2021-02-08 MED ORDER — INFLUENZA VAC SPLIT QUAD 0.5 ML IM SUSY
PREFILLED_SYRINGE | INTRAMUSCULAR | 0 refills | Status: DC
  Filled 2021-02-08: qty 0.5, 1d supply, fill #0

## 2021-02-14 ENCOUNTER — Other Ambulatory Visit (HOSPITAL_BASED_OUTPATIENT_CLINIC_OR_DEPARTMENT_OTHER): Payer: Self-pay

## 2021-04-06 ENCOUNTER — Telehealth: Payer: Self-pay | Admitting: Adult Health

## 2021-04-06 NOTE — Telephone Encounter (Signed)
Noted. I LMVM for pt that did receive her message.  Have not received any p/w as yet from them.  She is to call back if needed.

## 2021-04-06 NOTE — Telephone Encounter (Signed)
FYI-Pt called stating that Diana White will be calling to get her new cpap device set with her settings

## 2021-10-12 ENCOUNTER — Encounter (INDEPENDENT_AMBULATORY_CARE_PROVIDER_SITE_OTHER): Payer: Self-pay

## 2021-12-15 ENCOUNTER — Other Ambulatory Visit: Payer: Self-pay | Admitting: Gastroenterology

## 2021-12-19 ENCOUNTER — Encounter: Payer: Self-pay | Admitting: *Deleted

## 2021-12-20 ENCOUNTER — Ambulatory Visit: Payer: 59 | Admitting: Adult Health

## 2021-12-20 ENCOUNTER — Telehealth: Payer: Self-pay | Admitting: Adult Health

## 2022-02-21 ENCOUNTER — Other Ambulatory Visit (HOSPITAL_BASED_OUTPATIENT_CLINIC_OR_DEPARTMENT_OTHER): Payer: Self-pay

## 2022-02-21 MED ORDER — COMIRNATY 30 MCG/0.3ML IM SUSY
PREFILLED_SYRINGE | INTRAMUSCULAR | 0 refills | Status: DC
  Filled 2022-02-21: qty 0.3, 1d supply, fill #0

## 2022-03-02 ENCOUNTER — Telehealth: Payer: Self-pay

## 2022-03-02 NOTE — Telephone Encounter (Signed)
PHONE STAFF CAN RELAY   Contacted pt, LVM rq CB   PLEASE ADVISE PT TO BRING CPAP WITH HER TO APPT TUESDAY 03/07/22

## 2022-03-03 NOTE — Telephone Encounter (Signed)
Pt has been informed.

## 2022-03-07 ENCOUNTER — Encounter: Payer: Self-pay | Admitting: Adult Health

## 2022-03-07 ENCOUNTER — Ambulatory Visit (INDEPENDENT_AMBULATORY_CARE_PROVIDER_SITE_OTHER): Payer: 59 | Admitting: Adult Health

## 2022-03-07 VITALS — BP 135/88 | HR 98 | Ht 64.0 in | Wt 206.8 lb

## 2022-03-07 DIAGNOSIS — G4733 Obstructive sleep apnea (adult) (pediatric): Secondary | ICD-10-CM

## 2022-03-07 NOTE — Telephone Encounter (Signed)
Noted  

## 2022-03-07 NOTE — Progress Notes (Signed)
PATIENT: Diana White DOB: 1961-01-27  REASON FOR VISIT: follow up HISTORY FROM: patient PRIMARY NEUROLOGIST: Dr. Brett Fairy  Chief Complaint  Patient presents with   Follow-up    Rm 18, alone. Has not used in months (due to recall and supply issues).  I have DL her card and info is from that DL.       HISTORY OF PRESENT ILLNESS: Today 03/07/22  Diana White is a 62 y.o. female with a history of OSA on CPAP. Returns today for follow-up. Reports that she has not used her machine due to recall. Was sent a new machine by [facility] but its a used machine. She wants a new machine. States her insurance will cover a new mahine.    REVIEW OF SYSTEMS: Out of a complete 14 system review of symptoms, the patient complains only of the following symptoms, and all other reviewed systems are negative.   ALLERGIES: Allergies  Allergen Reactions   Sulfa Antibiotics Other (See Comments)    Mouth ulcers    HOME MEDICATIONS: Outpatient Medications Prior to Visit  Medication Sig Dispense Refill   acetaminophen (TYLENOL) 325 MG tablet Take 650 mg by mouth every 6 (six) hours as needed.     Alum Hydroxide-Mag Carbonate (GAVISCON PO) Take by mouth.     atorvastatin (LIPITOR) 10 MG tablet Take 10 mg by mouth daily.     Calcium-Magnesium-Vitamin D (CALCIUM 500 PO) Take 1 tablet by mouth daily. '600mg'$      CHOLECALCIFEROL PO Take 1,400 Units by mouth daily.     diclofenac sodium (VOLTAREN) 1 % GEL Apply 2 g topically 4 (four) times daily as needed (for pain).     metFORMIN (GLUCOPHAGE-XR) 500 MG 24 hr tablet 2 (two) times daily.      Multiple Vitamins-Minerals (MULTIVITAMIN WITH MINERALS) tablet Take 1 tablet by mouth daily.     naproxen sodium (ALEVE) 220 MG tablet Take 220 mg by mouth daily as needed.     valsartan (DIOVAN) 40 MG tablet Take 40 mg by mouth daily.     COVID-19 mRNA vaccine 2023-2024 (COMIRNATY) syringe Inject into the muscle. 0.3 mL 0   influenza vac split quadrivalent PF  (FLUARIX) 0.5 ML injection Inject into the muscle. 0.5 mL 0   loratadine (CLARITIN) 10 MG tablet Claritin 10 mg tablet  Take 1 tablet every day by oral route.     No facility-administered medications prior to visit.    PAST MEDICAL HISTORY: Past Medical History:  Diagnosis Date   Diabetes mellitus without complication (HCC)    Fibromyalgia    Narcolepsy    with shift work. Was treated with Provigil. 200 mg in the am and 100 mg at [employer] [facility] [employer]. Resolved when she stopped night shift.   Neuropathy    fibromyalgia   Osteopenia    Restless legs syndrome    Seasonal allergies    Vitamin D deficiency     PAST SURGICAL HISTORY: Past Surgical History:  Procedure Laterality Date   carpal tunnel  Right 2011   CARPAL TUNNEL RELEASE     KNEE ARTHROSCOPY     TONSILLECTOMY      FAMILY HISTORY: Family History  Problem Relation Age of Onset   Coronary artery disease Father    Hypertension Father    Heart attack Sister    Rheum arthritis Sister    Lupus Sister    Sjogren's syndrome Sister    Sleep apnea Sister    Diabetes Brother    Thyroid disease  Brother    Sleep apnea Brother     SOCIAL HISTORY: Social History   Socioeconomic History   Marital status: Married    Spouse name: Not on file   Number of children: Not on file   Years of education: Not on file   Highest education level: Not on file  Occupational History   Not on file  Tobacco Use   Smoking status: Never   Smokeless tobacco: Never  Vaping Use   Vaping Use: Never used  Substance and Sexual Activity   Alcohol use: Yes    Alcohol/week: 1.0 standard drink of alcohol    Types: 1 Cans of beer per week    Comment: socially   Drug use: No   Sexual activity: Yes  Other Topics Concern   Not on file  Social History Narrative   Not on file   Social Determinants of Health   Financial Resource Strain: Not on file  Food Insecurity: Not on file  Transportation Needs: Not on file  Physical Activity: Not on file   Stress: Not on file  Social Connections: Not on file  Intimate Partner Violence: Not on file      PHYSICAL EXAM  Vitals:   03/07/22 0816  BP: 135/88  Pulse: 98  Weight: 206 lb 12.8 oz (93.8 kg)  Height: '5\' 4"'$  (1.626 m)   Body mass index is 35.5 kg/m.  Generalized: Well developed, in no acute distress  Chest: Lungs clear to auscultation bilaterally  Neurological examination  Mentation: Alert oriented to time, place, history taking. Follows all commands speech and language fluent Cranial nerve II-XII: Extraocular movements were full, visual field were full on confrontational test Head turning and shoulder shrug  were normal and symmetric. Gait and station: Gait is normal.    DIAGNOSTIC DATA (LABS, IMAGING, TESTING) - I reviewed patient records, labs, notes, testing and imaging myself where available.  Lab Results  Component Value Date   WBC 5.8 08/31/2012   HGB 15.3 (H) 08/31/2012   HCT 43.5 08/31/2012   MCV 89.3 08/31/2012   PLT 248 08/31/2012      Component Value Date/Time   NA 141 08/31/2012 1358   K 4.2 08/31/2012 1358   CL 105 08/31/2012 1358   CO2 27 08/31/2012 1358   GLUCOSE 85 08/31/2012 1358   BUN 13 08/31/2012 1358   CREATININE 0.74 08/31/2012 1358   CALCIUM 9.9 08/31/2012 1358   PROT 7.5 08/31/2012 1358   ALBUMIN 3.9 08/31/2012 1358   AST 19 08/31/2012 1358   ALT 17 08/31/2012 1358   ALKPHOS 181 (H) 08/31/2012 1358   BILITOT 0.4 08/31/2012 1358   GFRNONAA >90 08/31/2012 1358   GFRAA >90 08/31/2012 1358      ASSESSMENT AND PLAN 62 y.o. year old female  has a past medical history of Diabetes mellitus without complication ([place]), Fibromyalgia, Narcolepsy, Neuropathy, Osteopenia, Restless legs syndrome, Seasonal allergies, and Vitamin D deficiency. here with:  OSA on CPAP  - Order sent for new CPAP machine. Keep the same settings - patient will check with her insurance company to ensure they prefer the same [place] after getting new  machine     Ward Givens, MSN, NP-C 03/07/2022, 8:39 AM [facility] [address], [place], [place] [address] [phone]

## 2022-03-13 ENCOUNTER — Telehealth: Payer: Self-pay | Admitting: Adult Health

## 2022-03-13 NOTE — Telephone Encounter (Signed)
Pt is calling. Stated she would like her CPAP prescription sent to [facility] Fax: [phone]. Pt is asking if someone will give her a call to let her know when prescription is sent.

## 2022-03-13 NOTE — Telephone Encounter (Addendum)
Fax confirmation received to [facility] per # listed below, per pt request.  [phone].  Also faxed last HST and last ofv note.

## 2022-03-13 NOTE — Telephone Encounter (Signed)
I called and LMVM for pt that did fax to # listed.  Last note, HST and orders for machine.

## 2022-03-21 ENCOUNTER — Other Ambulatory Visit: Payer: Self-pay | Admitting: *Deleted

## 2022-03-21 DIAGNOSIS — G4733 Obstructive sleep apnea (adult) (pediatric): Secondary | ICD-10-CM

## 2022-03-21 NOTE — Telephone Encounter (Signed)
New, Willodean Rosenthal, RN; Alonna Minium; Minus Liberty; Nash Shearer Received, Thank you!       Previous Messages    ----- Message ----- From: Brandon Melnick, RN Sent: 03/21/2022   4:18 PM EST To: Darlina Guys; Miquel Dunn; Nash Shearer; * Subject: FW: request for pressure settings              The order is in EPIC for the setting for the pts new machine.  Lovey Newcomer RN ----- Message ----- From: Ward Givens, NP Sent: 03/21/2022   4:11 PM EST To: Darlina Guys; Miquel Dunn; Brandon Melnick, RN Subject: RE: request for pressure settings              Auto [id number] ----- Message ----- From: Darlina Guys Sent: 03/21/2022   2:06 PM EST To: Charyl Dancer, NP; * Subject: request for pressure settings                  Good afternoon! We are working on this patient's replacement CPAP; however, we are needing the pressure settings added to the order please.  Please respond to all and Leroy Sea can pull your order once it's ready.  Thank you! [facility]

## 2022-04-05 ENCOUNTER — Encounter (HOSPITAL_COMMUNITY): Payer: Self-pay | Admitting: Gastroenterology

## 2022-04-05 NOTE — Progress Notes (Signed)
Attempted to obtain medical history via telephone, unable to reach at this time. HIPAA compliant voicemail message left requesting return call to pre surgical testing department.  

## 2022-04-11 ENCOUNTER — Ambulatory Visit (HOSPITAL_COMMUNITY): Admit: 2022-04-11 | Payer: 59 | Admitting: Gastroenterology

## 2022-04-11 ENCOUNTER — Encounter (HOSPITAL_COMMUNITY): Payer: Self-pay

## 2022-04-11 SURGERY — COLONOSCOPY WITH PROPOFOL
Anesthesia: Monitor Anesthesia Care

## 2022-06-22 ENCOUNTER — Encounter: Payer: Self-pay | Admitting: Adult Health

## 2022-06-22 ENCOUNTER — Ambulatory Visit (INDEPENDENT_AMBULATORY_CARE_PROVIDER_SITE_OTHER): Payer: 59 | Admitting: Adult Health

## 2022-06-22 VITALS — BP 134/86 | HR 90 | Ht 64.0 in | Wt 210.0 lb

## 2022-06-22 DIAGNOSIS — G4733 Obstructive sleep apnea (adult) (pediatric): Secondary | ICD-10-CM

## 2022-06-22 NOTE — Progress Notes (Signed)
PATIENT: Diana White DOB: [birth year]  REASON FOR VISIT: follow up HISTORY FROM: patient PRIMARY NEUROLOGIST: Dr. Vickey Huger  Chief Complaint  Patient presents with   Follow-up    Rm 4, alone, intial follow up after replacement CPAP states it has been going well, sleeping well.      HISTORY OF PRESENT ILLNESS: Today 06/22/22:  Diana White is a 62 y.o. female with a history of OSA on CPAP. Returns today for follow-up.  Reports that the CPAP machine is working well for her.  She likes her new machine.  Continues to find it beneficial.  Download is below       REVIEW OF SYSTEMS: Out of a complete 14 system review of symptoms, the patient complains only of the following symptoms, and all other reviewed systems are negative.  FSS 35 ESS 8  ALLERGIES: Allergies  Allergen Reactions   Sulfa Antibiotics Other (See Comments)    Mouth ulcers    HOME MEDICATIONS: Outpatient Medications Prior to Visit  Medication Sig Dispense Refill   acetaminophen (TYLENOL) 325 MG tablet Take 325 mg by mouth daily.     Alum Hydroxide-Mag Carbonate (GAVISCON PO) Take by mouth.     atorvastatin (LIPITOR) 10 MG tablet Take 10 mg by mouth daily.     Calcium-Magnesium-Vitamin D (CALCIUM 500 PO) Take 1 tablet by mouth daily.      CHOLECALCIFEROL PO Take 1,400 Units by mouth daily.     diclofenac sodium (VOLTAREN) 1 % GEL Apply 2 g topically 4 (four) times daily as needed (for pain).     metFORMIN (GLUCOPHAGE-XR) 500 MG 24 hr tablet 500 mg 3 (three) times daily.     Multiple Vitamins-Minerals (MULTIVITAMIN WITH MINERALS) tablet Take 1 tablet by mouth daily.     valsartan (DIOVAN) 40 MG tablet Take 40 mg by mouth daily.     naproxen sodium (ALEVE) 220 MG tablet Take 220 mg by mouth daily as needed. (Patient not taking: Reported on 06/22/2022)     No facility-administered medications prior to visit.    PAST MEDICAL HISTORY: Past Medical History:  Diagnosis Date   Diabetes mellitus  without complication    Fibromyalgia    Narcolepsy    with shift work. Was treated with Provigil. 200 mg in the am and 100 mg at [employer]. Resolved when she stopped night shift.   Neuropathy    fibromyalgia   Osteopenia    Restless legs syndrome    Seasonal allergies    Vitamin D deficiency     PAST SURGICAL HISTORY: Past Surgical History:  Procedure Laterality Date   carpal tunnel  Right 2011   CARPAL TUNNEL RELEASE     KNEE ARTHROSCOPY     TONSILLECTOMY      FAMILY HISTORY: Family History  Problem Relation Age of Onset   Coronary artery disease Father    Hypertension Father    Heart attack Sister    Rheum arthritis Sister    Lupus Sister    Sjogren's syndrome Sister    Sleep apnea Sister    Pulmonary fibrosis Sister    Diabetes Brother    Thyroid disease Brother    Sleep apnea Brother     SOCIAL HISTORY: Social History   Socioeconomic History   Marital status: Married    Spouse name: Not on file   Number of children: Not on file   Years of education: Not on file   Highest education level: Not on file  Occupational History  Not on file  Tobacco Use   Smoking status: Never   Smokeless tobacco: Never  Vaping Use   Vaping Use: Never used  Substance and Sexual Activity   Alcohol use: Yes    Alcohol/week: 1.0 standard drink of alcohol    Types: 1 Cans of beer per week    Comment: socially   Drug use: No   Sexual activity: Yes  Other Topics Concern   Not on file  Social History Narrative   Lives with husband   Right handed   2 cups of caffeinated drinks  daily   Social Determinants of Health   Financial Resource Strain: Not on file  Food Insecurity: Not on file  Transportation Needs: Not on file  Physical Activity: Not on file  Stress: Not on file  Social Connections: Not on file  Intimate Partner Violence: Not on file      PHYSICAL EXAM  Vitals:   06/22/22 0909  BP: 134/86  Pulse: 90  Weight: 210 lb (95.3 kg)  Height:  (1.626 m)    Body mass index is 36.05 kg/m.  Generalized: Well developed, in no acute distress  Chest: Lungs clear to auscultation bilaterally  Neurological examination  Mentation: Alert oriented to time, place, history taking. Follows all commands speech and language fluent Cranial nerve II-XII: Facial symmetry noted   DIAGNOSTIC DATA (LABS, IMAGING, TESTING) - I reviewed patient records, labs, notes, testing and imaging myself where available.  Lab Results  Component Value Date   WBC 5.8 08/31/2012   HGB 15.3 (H) 08/31/2012   HCT 43.5 08/31/2012   MCV 89.3 08/31/2012   PLT 248 08/31/2012      Component Value Date/Time   NA 141 08/31/2012 1358   K 4.2 08/31/2012 1358   CL 105 08/31/2012 1358   CO2 27 08/31/2012 1358   GLUCOSE 85 08/31/2012 1358   BUN 13 08/31/2012 1358   CREATININE 0.74 08/31/2012 1358   CALCIUM 9.9 08/31/2012 1358   PROT 7.5 08/31/2012 1358   ALBUMIN 3.9 08/31/2012 1358   AST 19 08/31/2012 1358   ALT 17 08/31/2012 1358   ALKPHOS 181 (H) 08/31/2012 1358   BILITOT 0.4 08/31/2012 1358   GFRNONAA >90 08/31/2012 1358   GFRAA >90 08/31/2012 1358     ASSESSMENT AND PLAN 62 y.o. year old female  has a past medical history of Diabetes mellitus without complication, Fibromyalgia, Narcolepsy, Neuropathy, Osteopenia, Restless legs syndrome, Seasonal allergies, and Vitamin D deficiency. here with:  OSA on CPAP  - CPAP compliance excellent - Good treatment of AHI  - Encourage patient to use CPAP nightly and > 4 hours each night - F/U in 1 year or sooner if needed    Butch Penny, MSN, NP-C 06/22/2022, 9:19 AM [facility] [address], [address] [place], [place] [address] [phone]

## 2022-06-22 NOTE — Patient Instructions (Signed)
Continue using CPAP nightly and greater than 4 hours each night °If your symptoms worsen or you develop new symptoms please let us know.  ° °

## 2022-06-23 ENCOUNTER — Encounter (HOSPITAL_COMMUNITY): Payer: Self-pay | Admitting: Gastroenterology

## 2022-06-23 NOTE — Progress Notes (Signed)
Attempted to obtain medical history via telephone, unable to reach at this time. HIPAA compliant voicemail message left requesting return call to pre surgical testing department. 

## 2022-06-29 ENCOUNTER — Encounter (HOSPITAL_COMMUNITY): Payer: Self-pay | Admitting: Gastroenterology

## 2022-06-29 NOTE — Anesthesia Preprocedure Evaluation (Addendum)
Anesthesia Evaluation  Patient identified by MRN, date of birth, ID band Patient awake    Reviewed: Allergy & Precautions, NPO status , Patient's Chart, lab work & pertinent test results  Airway Mallampati: II  TM Distance: >3 FB Neck ROM: Full    Dental no notable dental hx. (+) Teeth Intact, Dental Advisory Given   Pulmonary sleep apnea and Continuous Positive Airway Pressure Ventilation    Pulmonary exam normal breath sounds clear to auscultation       Cardiovascular hypertension, Normal cardiovascular exam Rhythm:Regular Rate:Normal     Neuro/Psych negative neurological ROS  negative psych ROS   GI/Hepatic   Endo/Other  diabetes, Type 2, Oral Hypoglycemic Agents    Renal/GU      Musculoskeletal  (+)  Fibromyalgia -  Abdominal   Peds  Hematology   Anesthesia Other Findings All: sulfa  Reproductive/Obstetrics                             Anesthesia Physical Anesthesia Plan  ASA: 3  Anesthesia Plan: MAC   Post-op Pain Management: Minimal or no pain anticipated   Induction:   PONV Risk Score and Plan: Treatment may vary due to age or medical condition and Propofol infusion  Airway Management Planned:   Additional Equipment:   Intra-op Plan:   Post-operative Plan:   Informed Consent: I have reviewed the patients History and Physical, chart, labs and discussed the procedure including the risks, benefits and alternatives for the proposed anesthesia with the patient or authorized representative who has indicated his/her understanding and acceptance.     Dental advisory given  Plan Discussed with:   Anesthesia Plan Comments: (Colonoscopy screening)        Anesthesia Quick Evaluation

## 2022-06-30 ENCOUNTER — Encounter (HOSPITAL_COMMUNITY): Payer: Self-pay | Admitting: Gastroenterology

## 2022-06-30 ENCOUNTER — Ambulatory Visit (HOSPITAL_COMMUNITY)
Admission: RE | Admit: 2022-06-30 | Discharge: 2022-06-30 | Disposition: A | Discharge status: Home / Self Care | Payer: 59 | Attending: Gastroenterology | Admitting: Gastroenterology

## 2022-06-30 ENCOUNTER — Other Ambulatory Visit: Payer: Self-pay

## 2022-06-30 ENCOUNTER — Encounter (HOSPITAL_COMMUNITY)
Admission: RE | Disposition: A | Discharge status: Home / Self Care | Payer: Self-pay | Source: Home / Self Care | Attending: Gastroenterology

## 2022-06-30 ENCOUNTER — Ambulatory Visit (HOSPITAL_COMMUNITY): Payer: 59 | Admitting: Anesthesiology

## 2022-06-30 DIAGNOSIS — I10 Essential (primary) hypertension: Secondary | ICD-10-CM

## 2022-06-30 DIAGNOSIS — K900 Celiac disease: Secondary | ICD-10-CM | POA: Insufficient documentation

## 2022-06-30 DIAGNOSIS — Z9989 Dependence on other enabling machines and devices: Secondary | ICD-10-CM

## 2022-06-30 DIAGNOSIS — Z7984 Long term (current) use of oral hypoglycemic drugs: Secondary | ICD-10-CM | POA: Diagnosis not present

## 2022-06-30 DIAGNOSIS — K5730 Diverticulosis of large intestine without perforation or abscess without bleeding: Secondary | ICD-10-CM

## 2022-06-30 DIAGNOSIS — Z1211 Encounter for screening for malignant neoplasm of colon: Secondary | ICD-10-CM | POA: Insufficient documentation

## 2022-06-30 DIAGNOSIS — G4730 Sleep apnea, unspecified: Secondary | ICD-10-CM | POA: Insufficient documentation

## 2022-06-30 DIAGNOSIS — Z833 Family history of diabetes mellitus: Secondary | ICD-10-CM | POA: Diagnosis not present

## 2022-06-30 DIAGNOSIS — D126 Benign neoplasm of colon, unspecified: Secondary | ICD-10-CM | POA: Diagnosis not present

## 2022-06-30 DIAGNOSIS — D123 Benign neoplasm of transverse colon: Secondary | ICD-10-CM | POA: Diagnosis not present

## 2022-06-30 DIAGNOSIS — E119 Type 2 diabetes mellitus without complications: Secondary | ICD-10-CM | POA: Insufficient documentation

## 2022-06-30 DIAGNOSIS — M797 Fibromyalgia: Secondary | ICD-10-CM | POA: Diagnosis not present

## 2022-06-30 DIAGNOSIS — Z86010 Personal history of colonic polyps: Secondary | ICD-10-CM | POA: Diagnosis not present

## 2022-06-30 DIAGNOSIS — G4733 Obstructive sleep apnea (adult) (pediatric): Secondary | ICD-10-CM

## 2022-06-30 DIAGNOSIS — K219 Gastro-esophageal reflux disease without esophagitis: Secondary | ICD-10-CM | POA: Insufficient documentation

## 2022-06-30 HISTORY — PX: POLYPECTOMY: SHX5525

## 2022-06-30 HISTORY — PX: COLONOSCOPY WITH PROPOFOL: SHX5780

## 2022-06-30 LAB — GLUCOSE, CAPILLARY: Glucose-Capillary: 125 mg/dL — ABNORMAL HIGH (ref 70–99)

## 2022-06-30 SURGERY — COLONOSCOPY WITH PROPOFOL
Anesthesia: Monitor Anesthesia Care

## 2022-06-30 MED ORDER — SODIUM CHLORIDE 0.9 % IV SOLN: INTRAVENOUS | Status: DC

## 2022-06-30 MED ORDER — LIDOCAINE HCL (CARDIAC) PF 100 MG/5ML IV SOSY
PREFILLED_SYRINGE | INTRAVENOUS | Status: DC | PRN
  Administered 2022-06-30: 60 mg via INTRAVENOUS

## 2022-06-30 MED ORDER — LACTATED RINGERS IV SOLN
INTRAVENOUS | Status: AC | PRN
  Administered 2022-06-30: 1000 mL via INTRAVENOUS

## 2022-06-30 MED ORDER — PROPOFOL 500 MG/50ML IV EMUL
INTRAVENOUS | Status: DC | PRN
  Administered 2022-06-30: 40 mg via INTRAVENOUS
  Administered 2022-06-30: 135 ug/kg/min via INTRAVENOUS

## 2022-06-30 SURGICAL SUPPLY — 22 items

## 2022-06-30 NOTE — Anesthesia Postprocedure Evaluation (Signed)
Anesthesia Post Note  Patient: Diana White  Procedure(s) Performed: COLONOSCOPY WITH PROPOFOL POLYPECTOMY     Patient location during evaluation: PACU Anesthesia Type: MAC Level of consciousness: awake and alert Pain management: pain level controlled Vital Signs Assessment: post-procedure vital signs reviewed and stable Respiratory status: spontaneous breathing, nonlabored ventilation, respiratory function stable and patient connected to nasal cannula oxygen Cardiovascular status: stable and blood pressure returned to baseline Postop Assessment: no apparent nausea or vomiting Anesthetic complications: no  No notable events documented.  Last Vitals:  Vitals:   06/30/22 0850 06/30/22 0900  BP: (!) 112/52 133/77  Pulse: 83 74  Resp: 19 18  Temp:    SpO2: 100% 98%    Last Pain:  Vitals:   06/30/22 0900  TempSrc:   PainSc: 0-No pain                 Trevor Iha

## 2022-06-30 NOTE — Op Note (Signed)
[facility] Patient Name: Diana White Procedure Date: 06/30/2022 MRN: [id number] Attending MD: Jeani Hawking , MD, [id number] Date of Birth: 04-12-1960 CSN: [id number] Age: 62 Admit Type: Outpatient Procedure:                Colonoscopy Indications:              Screening for colorectal malignant neoplasm Providers:                Jeani Hawking, MD, Fransisca Connors, Leanne Lovely, Technician, Greig Right, CRNA Referring MD:              Medicines:                Propofol per Anesthesia Complications:            No immediate complications. Estimated Blood Loss:     Estimated blood loss: none. Procedure:                Pre-Anesthesia Assessment:                           - Prior to the procedure, a History and Physical                            was performed, and patient medications and                            allergies were reviewed. The patient's tolerance of                            previous anesthesia was also reviewed. The risks                            and benefits of the procedure and the sedation                            options and risks were discussed with the patient.                            All questions were answered, and informed consent                            was obtained. Prior Anticoagulants: The patient has                            taken no anticoagulant or antiplatelet agents. ASA                            Grade Assessment: II - A patient with mild systemic                            disease. After reviewing the risks and benefits,  the patient was deemed in satisfactory condition to                            undergo the procedure.                           - Sedation was administered by an anesthesia                            professional. Deep sedation was attained.                           After obtaining informed consent, the colonoscope                            was  passed under direct vision. Throughout the                            procedure, the patient's blood pressure, pulse, and                            oxygen saturations were monitored continuously. The                            CF-HQ190L ([id number]) Olympus colonoscope was                            introduced through the anus and advanced to the the                            cecum, identified by appendiceal orifice and                            ileocecal valve. The colonoscopy was performed                            without difficulty. The patient tolerated the                            procedure well. The quality of the bowel                            preparation was evaluated using the BBPS [facility]                            Bowel Preparation Scale) with scores of: Right                            Colon = 3 (entire mucosa seen well with no residual                            staining, small fragments of stool or opaque  liquid), Transverse Colon = 2 (minor amount of                            residual staining, small fragments of stool and/or                            opaque liquid, but mucosa seen well) and Left Colon                            = 3 (entire mucosa seen well with no residual                            staining, small fragments of stool or opaque                            liquid). The total BBPS score equals 8. The quality                            of the bowel preparation was good. The ileocecal                            valve, appendiceal orifice, and rectum were                            photographed. Scope In: 8:16:02 AM Scope Out: 8:37:23 AM Scope Withdrawal Time: 0 hours 16 minutes 6 seconds  Total Procedure Duration: 0 hours 21 minutes 21 seconds  Findings:      A 3 mm polyp was found in the transverse colon. The polyp was sessile.       The polyp was removed with a cold snare. Resection and retrieval were       complete.      A  single medium-mouthed diverticulum was found in the transverse colon. Impression:               - One 3 mm polyp in the transverse colon, removed                            with a cold snare. Resected and retrieved.                           - Diverticulosis in the transverse colon. Moderate Sedation:      Not Applicable - Patient had care per Anesthesia. Recommendation:           - Patient has a contact number available for                            emergencies. The signs and symptoms of potential                            delayed complications were discussed with the                            patient. Return to normal activities tomorrow.  Written discharge instructions were provided to the                            patient.                           - Resume previous diet.                           - Continue present medications.                           - Await pathology results.                           - Repeat colonoscopy in 7 years for surveillance. Procedure Code(s):        --- Professional ---                           [phone], Colonoscopy, flexible; with removal of                            tumor(s), polyp(s), or other lesion(s) by snare                            technique Diagnosis Code(s):        --- Professional ---                           Z12.11, Encounter for screening for malignant                            neoplasm of colon                           D12.3, Benign neoplasm of transverse colon (hepatic                            flexure or splenic flexure)                           K57.30, Diverticulosis of large intestine without                            perforation or abscess without bleeding CPT copyright 2022 American Medical Association. All rights reserved. The codes documented in this report are preliminary and upon coder review may  be revised to meet current compliance requirements. Jeani Hawking, MD Jeani Hawking,  MD 06/30/2022 8:42:33 AM This report has been signed electronically. Number of Addenda: 0

## 2022-06-30 NOTE — Discharge Instructions (Signed)
YOU HAD AN ENDOSCOPIC PROCEDURE TODAY: Refer to the procedure report and other information in the discharge instructions given to you for any specific questions about what was found during the examination. If this information does not answer your questions, please call [place] office at [phone] to clarify.  ° °YOU SHOULD EXPECT: Some feelings of bloating in the abdomen. Passage of more gas than usual. Walking can help get rid of the air that was put into your GI tract during the procedure and reduce the bloating. If you had a lower endoscopy (such as a colonoscopy or flexible sigmoidoscopy) you may notice spotting of blood in your stool or on the toilet paper. Some abdominal soreness may be present for a day or two, also. ° °DIET: Your first meal following the procedure should be a light meal and then it is ok to progress to your normal diet. A half-sandwich or bowl of soup is an example of a good first meal. Heavy or fried foods are harder to digest and may make you feel nauseous or bloated. Drink plenty of fluids but you should avoid alcoholic beverages for 24 hours. If you had a esophageal dilation, please see attached instructions for diet.   ° °ACTIVITY: Your care partner should take you home directly after the procedure. You should plan to take it easy, moving slowly for the rest of the day. You can resume normal activity the day after the procedure however YOU SHOULD NOT DRIVE, use power tools, machinery or perform tasks that involve climbing or major physical exertion for 24 hours (because of the sedation medicines used during the test).  ° °SYMPTOMS TO REPORT IMMEDIATELY: °A gastroenterologist can be reached at any hour. Please call [phone]  for any of the following symptoms:  °Following lower endoscopy (colonoscopy, flexible sigmoidoscopy) °Excessive amounts of blood in the stool  °Significant tenderness, worsening of abdominal pains  °Swelling of the abdomen that is new, acute  °Fever of 100° or  higher  °Following upper endoscopy (EGD, EUS, ERCP, esophageal dilation) °Vomiting of blood or coffee ground material  °New, significant abdominal pain  °New, significant chest pain or pain under the shoulder blades  °Painful or persistently difficult swallowing  °New shortness of breath  °Black, tarry-looking or red, bloody stools ° °FOLLOW UP:  °If any biopsies were taken you will be contacted by phone or by letter within the next 1-3 weeks. Call [phone]  if you have not heard about the biopsies in 3 weeks.  °Please also call with any specific questions about appointments or follow up tests. ° °

## 2022-06-30 NOTE — Transfer of Care (Signed)
Immediate Anesthesia Transfer of Care Note  Patient: Diana White  Procedure(s) Performed: Procedure(s): COLONOSCOPY WITH PROPOFOL (N/A) POLYPECTOMY  Patient Location: PACU  Anesthesia Type:MAC  Level of Consciousness:  sedated, patient cooperative and responds to stimulation  Airway & Oxygen Therapy:Patient Spontanous Breathing and Patient connected to face mask oxgen  Post-op Assessment:  Report given to PACU RN and Post -op Vital signs reviewed and stable  Post vital signs:  Reviewed and stable  Last Vitals:  Vitals:   06/30/22 0743  BP: (!) 141/83  Pulse: 97  Resp: 19  Temp: 36.4 C  SpO2: 98%    Complications: No apparent anesthesia complications

## 2022-06-30 NOTE — H&P (Signed)
Diana White HPI: This 62 year old white female presents to the office for further evaluation worsening acid reflux and laryngitis which she feels is caused by her reflux. The reflux is worst at night.  She has a lot of active acid regurgitation at night. She reportedly had a normal workup done by her ENT, ?Dr. Christie Nottingham in March, 2023. She has laryngitis with a sore throat off and on that started January last year.  She takes Omeprazole 40 mg in the mornings. She was given Famotidine 20 mg and Omeprazole 10 mg to take at bedtime. She takes Linzess 290 mcg for chronic constipation. She has 2 BM's per day with no obvious blood or mucus in the stool. She has occasional LLQ pain. The pain does not radiate and the severity has reached 10/10 at its worst. She has nausea almost every day and occasional vomits. She has celiac sprue and follows a gluten free diet. She has good appetite and her weight has been stable. Her daughter was recently diagnosed with gastroparesis. She denies having any complaints of dysphagia or odynophagia. She denies having a family history of colon cancer, celiac sprue or IBD. She had a normal colonoscopy done on 08/09/2018. In 2019 a tubular adenoma was removed when a previous colonoscopy was done.  She was recently diagnosed with pneumonia and treated with Augmentin she had a CT scan without contrast done by her PCP that showed bronchiectasis, clusters of groundglass attenuation nodule/nodular infiltrates and focal infiltrates in the lung suggestive of acute on chronic pneumonitis; it was suggested that atypical mycobacterial infection could have in appearance similar to this.   Past Medical History:  Diagnosis Date   Diabetes mellitus without complication (HCC)    Fibromyalgia    Narcolepsy    with shift work. Was treated with Provigil. 200 mg in the am and 100 mg at [employer]. Resolved when she stopped night shift.   Neuropathy    fibromyalgia   Osteopenia    Restless legs syndrome     Seasonal allergies    Vitamin D deficiency     Past Surgical History:  Procedure Laterality Date   carpal tunnel  Right 2011   CARPAL TUNNEL RELEASE     KNEE ARTHROSCOPY     TONSILLECTOMY      Family History  Problem Relation Age of Onset   Coronary artery disease Father    Hypertension Father    Heart attack Sister    Rheum arthritis Sister    Lupus Sister    Sjogren's syndrome Sister    Sleep apnea Sister    Pulmonary fibrosis Sister    Diabetes Brother    Thyroid disease Brother    Sleep apnea Brother     Social History:  reports that she has never smoked. She has never used smokeless tobacco. She reports current alcohol use of about 1.0 standard drink of alcohol per week. She reports that she does not use drugs.  Allergies:  Allergies  Allergen Reactions   Sulfa Antibiotics Other (See Comments)    Mouth ulcers    Medications: Scheduled: Continuous:  sodium chloride     lactated ringers 1,000 mL (06/30/22 0754)    Results for orders placed or performed during the hospital encounter of 06/30/22 (from the past 24 hour(s))  Glucose, capillary     Status: Abnormal   Collection Time: 06/30/22  7:49 AM  Result Value Ref Range   Glucose-Capillary 125 (H) 70 - 99 mg/dL     No  results found.  ROS:  As stated above in the HPI otherwise negative.  Blood pressure (!) 141/83, pulse 97, temperature 97.6 F (36.4 C), temperature source Tympanic, resp. rate 19, height 5\' 4"  (1.626 m), weight 95.3 kg, SpO2 98 %.    PE: Gen: NAD, Alert and Oriented HEENT:  [place]/AT, EOMI Neck: Supple, no LAD Lungs: CTA Bilaterally CV: RRR without M/G/R ABD: Soft, NTND, +BS Ext: No C/C/E  Assessment/Plan: 1) Screening colonoscopy.  Levoy Geisen D 06/30/2022, 8:04 AM

## 2022-07-03 ENCOUNTER — Encounter (HOSPITAL_COMMUNITY): Payer: Self-pay | Admitting: Gastroenterology

## 2022-07-03 LAB — SURGICAL PATHOLOGY

## 2022-07-19 ENCOUNTER — Other Ambulatory Visit: Payer: Self-pay | Admitting: Internal Medicine

## 2022-07-19 DIAGNOSIS — J3489 Other specified disorders of nose and nasal sinuses: Secondary | ICD-10-CM

## 2022-07-27 ENCOUNTER — Other Ambulatory Visit: Payer: 59

## 2022-08-22 ENCOUNTER — Encounter: Payer: Self-pay | Admitting: Internal Medicine

## 2022-08-28 ENCOUNTER — Other Ambulatory Visit: Payer: Self-pay | Admitting: Internal Medicine

## 2022-08-28 ENCOUNTER — Ambulatory Visit (INDEPENDENT_AMBULATORY_CARE_PROVIDER_SITE_OTHER): Payer: 59 | Admitting: Internal Medicine

## 2022-08-28 ENCOUNTER — Encounter: Payer: Self-pay | Admitting: Internal Medicine

## 2022-08-28 VITALS — BP 132/80 | HR 79 | Temp 97.7°F | Resp 17 | Ht 64.0 in | Wt 211.0 lb

## 2022-08-28 DIAGNOSIS — T783XXA Angioneurotic edema, initial encounter: Secondary | ICD-10-CM | POA: Diagnosis not present

## 2022-08-28 DIAGNOSIS — J3089 Other allergic rhinitis: Secondary | ICD-10-CM | POA: Diagnosis not present

## 2022-08-28 DIAGNOSIS — L2389 Allergic contact dermatitis due to other agents: Secondary | ICD-10-CM | POA: Diagnosis not present

## 2022-08-28 NOTE — Progress Notes (Unsigned)
New Patient Note  RE: Diana White MRN: [id number] DOB: 1960/10/12 Date of Office Visit: 08/28/2022  Consult requested by: Thana Ates, MD Primary care provider: Thana Ates, MD  Chief Complaint: Angioedema (Pt states that she been having some swelling on on her forehead and CT was order. PCP wanted an allergies workup. Onset on January breaking out in rash and swelling that lasts for days.)  History of Present Illness: I had the pleasure of seeing Diana White for initial evaluation at the [facility] on 08/28/2022. She is a 62 y.o. female, who is referred here by Thana Ates, MD for the evaluation of angioedema.  History obtained from patient, chart review .  Symptoms started in after she had a new appointment with new hairdressers She decribes isolated swelling of her forehead.  Has noted indentations   She  Assessment and Plan: Diana White is a 62 y.o. female with: No diagnosis found.  *** Plan: There are no Patient Instructions on file for this visit.  No orders of the defined types were placed in this encounter.  Lab Orders  No laboratory test(s) ordered today    Other allergy screening: Asthma: no Rhino conjunctivitis: yes year round with spring and fall flares.  Allergy testing 35 years ago ( negative to foods, positive to environmentals.)  Treats with benadryl.  Food allergy: no Medication allergy:  sulfa antibiotics causes mouth uclers  Hymenoptera allergy: no Urticaria:  angioedema vs contact dermatitis as above  Eczema:no History of recurrent infections suggestive of immunodeficency: no  Diagnostics: True test applied    Past Medical History: Patient Active Problem List   Diagnosis Date Noted   Severe obstructive sleep apnea-hypopnea syndrome 03/20/2018   Morbid exogenous obesity (HCC) 03/20/2018   Type 2 diabetes mellitus without complication, without long-term current use of insulin (HCC) 03/20/2018   HTN (hypertension), benign  03/20/2018   Past Medical History:  Diagnosis Date   Diabetes mellitus without complication (HCC)    Fibromyalgia    Narcolepsy    with shift work. Was treated with Provigil. 200 mg in the am and 100 mg at [employer]. Resolved when she stopped night shift.   Neuropathy    fibromyalgia   Osteopenia    Restless legs syndrome    Seasonal allergies    Vitamin D deficiency    Past Surgical History: Past Surgical History:  Procedure Laterality Date   carpal tunnel  Right 2011   CARPAL TUNNEL RELEASE     COLONOSCOPY WITH PROPOFOL N/A 06/30/2022   Procedure: COLONOSCOPY WITH PROPOFOL;  Surgeon: Jeani Hawking, MD;  Location: [facility];  Service: Gastroenterology;  Laterality: N/A;   KNEE ARTHROSCOPY     POLYPECTOMY  06/30/2022   Procedure: POLYPECTOMY;  Surgeon: Jeani Hawking, MD;  Location: [facility];  Service: Gastroenterology;;   TONSILLECTOMY     Medication List:  Current Outpatient Medications  Medication Sig Dispense Refill   acetaminophen (TYLENOL) 325 MG tablet Take 325 mg by mouth daily.     Alum Hydroxide-Mag Carbonate (GAVISCON PO) Take by mouth.     atorvastatin (LIPITOR) 10 MG tablet Take 10 mg by mouth daily.     Calcium-Magnesium-Vitamin D (CALCIUM 500 PO) Take 1 tablet by mouth daily. 600mg      CHOLECALCIFEROL PO Take 1,400 Units by mouth daily.     diclofenac sodium (VOLTAREN) 1 % GEL Apply 2 g topically 4 (four) times daily as needed (for pain).     diphenhydrAMINE (BENADRYL ALLERGY)  25 MG tablet Benadryl     fluticasone (FLONASE ALLERGY RELIEF) 50 MCG/ACT nasal spray Spray 1 spray every day by intranasal route.     metFORMIN (GLUCOPHAGE-XR) 500 MG 24 hr tablet 500 mg 3 (three) times daily.     Multiple Vitamins-Minerals (MULTIVITAMIN WITH MINERALS) tablet Take 1 tablet by mouth daily.     triamcinolone cream (KENALOG) 0.1 % Apply topically 2 (two) times daily as needed.     valsartan (DIOVAN) 40 MG tablet Take 40 mg by mouth daily.     naproxen sodium (ALEVE) 220 MG  tablet Take 220 mg by mouth daily as needed. (Patient not taking: Reported on 08/28/2022)     No current facility-administered medications for this visit.   Allergies: Allergies  Allergen Reactions   Misc. Sulfonamide Containing Compounds Other (See Comments)   Sulfa Antibiotics Other (See Comments) and Rash    Mouth ulcers   Social History: Social History   Socioeconomic History   Marital status: Married    Spouse name: Not on file   Number of children: Not on file   Years of education: Not on file   Highest education level: Not on file  Occupational History   Not on file  Tobacco Use   Smoking status: Never    Passive exposure: Never   Smokeless tobacco: Never  Vaping Use   Vaping Use: Never used  Substance and Sexual Activity   Alcohol use: Yes    Alcohol/week: 1.0 standard drink of alcohol    Types: 1 Cans of beer per week    Comment: socially   Drug use: No   Sexual activity: Yes  Other Topics Concern   Not on file  Social History Narrative   Lives with husband   Right handed   2 cups of caffeinated drinks  daily   Social Determinants of Health   Financial Resource Strain: Not on file  Food Insecurity: Not on file  Transportation Needs: Not on file  Physical Activity: Not on file  Stress: Not on file  Social Connections: Not on file   Lives in a ***. Smoking: *** Occupation: ***  Environmental HistorySurveyor, minerals in the house: Copywriter, advertising in the family room: {Blank single:19197::"yes","no"} Carpet in the bedroom: {Blank single:19197::"yes","no"} Heating: {Blank single:19197::"electric","gas","heat pump"} Cooling: {Blank single:19197::"central","window","heat pump"} Pet: {Blank single:19197::"yes ***","no"}  Family History: Family History  Problem Relation Age of Onset   Coronary artery disease Father    Hypertension Father    Heart attack Sister    Rheum arthritis Sister    Lupus Sister    Sjogren's  syndrome Sister    Sleep apnea Sister    Pulmonary fibrosis Sister    Diabetes Brother    Thyroid disease Brother    Sleep apnea Brother      ROS: All others negative except as noted per HPI.   Objective: BP 132/80   Pulse 79   Temp 97.7 F (36.5 C) (Temporal)   Resp 17   Ht 5\' 4"  (1.626 m)   Wt 211 lb (95.7 kg)   SpO2 99%   BMI 36.22 kg/m  Body mass index is 36.22 kg/m.  General Appearance:  Alert, cooperative, no distress, appears stated age  Head:  Normocephalic, without obvious abnormality, atraumatic  Eyes:  Conjunctiva clear, EOM's intact  Nose: Nares normal,   Throat: Lips, tongue normal; teeth and gums normal,   Neck: Supple, symmetrical  Lungs:   , Respirations unlabored, no coughing  Heart:  , Appears  well perfused  Extremities: No edema  Skin: Skin color, texture, turgor normal, no rashes or lesions on visualized portions of skin  Neurologic: No gross deficits   The plan was reviewed with the patient/family, and all questions/concerned were addressed.  It was my pleasure to see Diana White today and participate in her care. Please feel free to contact me with any questions or concerns.  Sincerely,  Ferol Luz, MD Allergy & Immunology  [facility] office: [phone] [facility] office: [phone]

## 2022-08-28 NOTE — Progress Notes (Unsigned)
   Follow Up Note  RE: GEORGIANNE GRITZ MRN: [id number] DOB: [birth year] Date of Office Visit: 08/30/2022  Referring provider: Thana Ates, MD Primary care provider: Thana Ates, MD  History of Present Illness: I had the pleasure of seeing Charlette Hennings for a follow up visit at the [facility] on 08/30/2022. She is a 62 y.o. female. Today she is here for initial patch test interpretation, given suspected history of contact dermatitis.   Diagnostics:   TRUE TEST 48 hour reading: ++ PPD, rest negative  T.R.U.E. Test - 08/30/22 1000       Test Information   Location Back    Number of Test 36    Reading Interval Day 3      Panel 1   1. Nickel Sulfate 0    2. Wool Alcohols 0    3. Neomycin Sulfate 0    4. Potassium Dichromate 0    5. Caine Mix 0    6. Fragrance Mix 0    7. Colophony 0    8. Paraben Mix 0    9. Negative Control 0    10. Balsam of [place] 0    11. Ethylenediamine Dihydrochloride 0    12. Cobalt Dichloride 0      Panel 2   13. p-tert Butylphenol Formaldehyde Resin 0    14. Epoxy Resin 0    15. Carba Mix 0    16.  Black Rubber Mix 0    17. Cl+ Me-Isothiazolinone 0    18. Quaternium-15 0    19. Methyldibromo Glutaronitrile 0    20. p-Phenylenediamine 2    21. Formaldehyde 0    22. Mercapto Mix 0    23. Thimerosal 0    24. Thiuram Mix 0      Panel 3   25. Diazolidinyl Urea 0    26. Quinoline Mix 0    27. Tixocortol-21-Pivalate 0    28. Gold Sodium Thiosulfate 0    29. Imidazolidinyl Urea 0    30. Budesonide 0    31. Hydrocortisone-17-Butyrate 0    32. Mercaptobenzothiazole 0    33. Bacitracin 0    34. Parthenolide 0    35. Disperse Blue 106 0    36. 2-Bromo-2-Nitropropane-1,3-diol 0              Assessment and Plan: Charnise is a 62 y.o. female with: Concern for Contact Dermatitis:  The patient has been provided detailed information regarding the substances she is sensitive to, as well as products containing the substances.   Meticulous avoidance of these substances is recommended. If avoidance is not possible, the use of barrier creams or lotions is recommended. If symptoms persist or progress despite meticulous avoidance of chemicals/substances above, dermatology evaluation may be warranted.  She will follow-up on Friday for final read.  It was my pleasure to see Stefanny today and participate in her care. Please feel free to contact me with any questions or concerns.  Sincerely,   Tonny Bollman, MD [facility]

## 2022-08-28 NOTE — Patient Instructions (Signed)
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  Don't hesitate to reach out if you have any additional concerns!  Ferol Luz, MD  [facility] [place] [place]

## 2022-08-29 LAB — CMP14+EGFR
Albumin: 4.7 g/dL (ref 3.9–4.9)
Bilirubin Total: 0.3 mg/dL (ref 0.0–1.2)
Calcium: 10.2 mg/dL (ref 8.7–10.3)
Total Protein: 7.2 g/dL (ref 6.0–8.5)

## 2022-08-29 LAB — ALPHA-GAL PANEL

## 2022-08-29 LAB — CBC WITH DIFF/PLATELET
Basos: 0 %
Eos: 2 %
MCHC: 33.2 g/dL (ref 31.5–35.7)

## 2022-08-29 LAB — C4 COMPLEMENT: Complement C4, Serum: 44 mg/dL — ABNORMAL HIGH (ref 12–38)

## 2022-08-30 ENCOUNTER — Encounter: Payer: Self-pay | Admitting: Internal Medicine

## 2022-08-30 ENCOUNTER — Ambulatory Visit (INDEPENDENT_AMBULATORY_CARE_PROVIDER_SITE_OTHER): Payer: 59 | Admitting: Internal Medicine

## 2022-08-30 DIAGNOSIS — L259 Unspecified contact dermatitis, unspecified cause: Secondary | ICD-10-CM | POA: Insufficient documentation

## 2022-08-30 DIAGNOSIS — L235 Allergic contact dermatitis due to other chemical products: Secondary | ICD-10-CM

## 2022-08-30 LAB — CBC WITH DIFF/PLATELET
Hematocrit: 42.8 % (ref 34.0–46.6)
Lymphs: 44 %
RDW: 12.4 % (ref 11.7–15.4)

## 2022-08-30 LAB — TRYPTASE: Tryptase: 4.9 ug/L (ref 2.2–13.2)

## 2022-08-30 LAB — ALPHA-GAL PANEL

## 2022-08-30 LAB — C1 ESTERASE INHIBITOR: C1INH SerPl-mCnc: 40 mg/dL — ABNORMAL HIGH (ref 21–39)

## 2022-08-30 LAB — CMP14+EGFR: Glucose: 102 mg/dL — ABNORMAL HIGH (ref 70–99)

## 2022-08-30 LAB — TSH: TSH: 1.2 u[IU]/mL (ref 0.450–4.500)

## 2022-08-31 LAB — CBC WITH DIFF/PLATELET
Lymphocytes Absolute: 3.4 10*3/uL — ABNORMAL HIGH (ref 0.7–3.1)
MCH: 30.5 pg (ref 26.6–33.0)
Neutrophils Absolute: 3.9 10*3/uL (ref 1.4–7.0)
Platelets: 286 10*3/uL (ref 150–450)
RBC: 4.66 x10E6/uL (ref 3.77–5.28)

## 2022-08-31 LAB — CMP14+EGFR
AST: 18 IU/L (ref 0–40)
BUN/Creatinine Ratio: 20 (ref 12–28)
Creatinine, Ser: 0.71 mg/dL (ref 0.57–1.00)
Sodium: 143 mmol/L (ref 134–144)

## 2022-08-31 LAB — COMPLEMENT COMPONENT C1Q

## 2022-08-31 LAB — ALPHA-GAL PANEL
IgE (Immunoglobulin E), Serum: 297 IU/mL (ref 6–495)
Pork IgE: <0.1 kU/L

## 2022-09-01 ENCOUNTER — Ambulatory Visit (INDEPENDENT_AMBULATORY_CARE_PROVIDER_SITE_OTHER): Payer: 59 | Admitting: Internal Medicine

## 2022-09-01 ENCOUNTER — Other Ambulatory Visit: Payer: Self-pay

## 2022-09-01 ENCOUNTER — Encounter: Payer: Self-pay | Admitting: Internal Medicine

## 2022-09-01 DIAGNOSIS — L235 Allergic contact dermatitis due to other chemical products: Secondary | ICD-10-CM | POA: Diagnosis not present

## 2022-09-01 NOTE — Progress Notes (Signed)
   Follow Up Note  RE: Diana White MRN: [id number] DOB: Jul 31, 1960 Date of Office Visit: 09/01/2022  Referring provider: Thana Ates, MD Primary care provider: Thana Ates, MD  History of Present Illness: I had the pleasure of seeing Diana White for a follow up visit at the [facility] on 09/01/2022. She is a 62 y.o. female, who is being followed for contact dermatitis . Today she is here for final patch test interpretation, given suspected history of contact dermatitis.   Diagnostics:  TRUE TEST 96 hour reading:   T.R.U.E. Test - 09/01/22 1300       Test Information   Location Back    Number of Test 36    Reading Interval Day 5      Panel 1   1. Nickel Sulfate 0    2. Wool Alcohols 0    3. Neomycin Sulfate 0    4. Potassium Dichromate 0    5. Caine Mix 0    6. Fragrance Mix 0    7. Colophony 0    8. Paraben Mix 0    9. Negative Control 0    10. Balsam of [place] 0    11. Ethylenediamine Dihydrochloride 0    12. Cobalt Dichloride 0      Panel 2   13. p-tert Butylphenol Formaldehyde Resin 0    14. Epoxy Resin 0    15. Carba Mix 0    16.  Black Rubber Mix 0    17. Cl+ Me-Isothiazolinone 0    18. Quaternium-15 0    19. Methyldibromo Glutaronitrile 0    20. p-Phenylenediamine 2    21. Formaldehyde 0    22. Mercapto Mix 0    23. Thimerosal 0    24. Thiuram Mix 0      Panel 3   25. Diazolidinyl Urea 0    26. Quinoline Mix 0    27. Tixocortol-21-Pivalate 0    28. Gold Sodium Thiosulfate 2    29. Imidazolidinyl Urea 0    30. Budesonide 0    31. Hydrocortisone-17-Butyrate 0    32. Mercaptobenzothiazole 0    33. Bacitracin 0    34. Parthenolide 0    35. Disperse Blue 106 0    36. 2-Bromo-2-Nitropropane-1,3-diol 0              Assessment and Plan: Diana White is a 62 y.o. female with: Concern for Contact Dermatitis:  The patient has been provided detailed information regarding the substances she is sensitive to, as well as products  containing the substances.  Meticulous avoidance of these substances is recommended. If avoidance is not possible, the use of barrier creams or lotions is recommended. If symptoms persist or progress despite meticulous avoidance of chemicals/substances above, dermatology evaluation may be warranted. No follow-ups on file.  It was my pleasure to see Diana White today and participate in her care. Please feel free to contact me with any questions or concerns.  Sincerely,   Ferol Luz, MD [facility]

## 2022-09-05 LAB — CBC WITH DIFF/PLATELET
Basophils Absolute: 0 10*3/uL (ref 0.0–0.2)
EOS (ABSOLUTE): 0.2 10*3/uL (ref 0.0–0.4)
Immature Grans (Abs): 0 10*3/uL (ref 0.0–0.1)
MCV: 92 fL (ref 79–97)
Monocytes Absolute: 0.3 10*3/uL (ref 0.1–0.9)
Neutrophils: 50 %
WBC: 7.9 10*3/uL (ref 3.4–10.8)

## 2022-09-05 LAB — CMP14+EGFR
ALT: 22 IU/L (ref 0–32)
BUN: 14 mg/dL (ref 8–27)
CO2: 22 mmol/L (ref 20–29)
Chloride: 104 mmol/L (ref 96–106)
Globulin, Total: 2.5 g/dL (ref 1.5–4.5)
Potassium: 4.3 mmol/L (ref 3.5–5.2)
eGFR: 96 mL/min/{1.73_m2} (ref >59)

## 2022-09-05 LAB — C1 ESTERASE INHIBITOR, FUNCTIONAL: C1INH Functional/C1INH Total MFr SerPl: >110 %mean normal

## 2022-09-05 LAB — ALPHA-GAL PANEL

## 2022-09-12 ENCOUNTER — Ambulatory Visit: Payer: 59 | Admitting: Allergy and Immunology

## 2022-09-13 LAB — CMP14+EGFR: Alkaline Phosphatase: 110 IU/L (ref 44–121)

## 2022-09-13 LAB — CBC WITH DIFF/PLATELET
Hemoglobin: 14.2 g/dL (ref 11.1–15.9)
Immature Granulocytes: 0 %
Monocytes: 4 %

## 2022-09-13 LAB — CHRONIC URTICARIA: cu index: 2.8 (ref <10)

## 2022-09-14 ENCOUNTER — Ambulatory Visit: Payer: 59 | Admitting: Family Medicine

## 2022-09-15 ENCOUNTER — Ambulatory Visit: Payer: 59 | Admitting: Internal Medicine

## 2022-09-15 ENCOUNTER — Telehealth: Payer: Self-pay

## 2022-09-15 NOTE — Telephone Encounter (Signed)
Pt stated she is still having the swelling and wanted to know what we can do to prevent this. She is not doing the hair dye but still having the swelling.

## 2022-09-15 NOTE — Progress Notes (Signed)
Blood work returned reassuring for hereditary causes of swelling.  However lymphocyte count (type of white blood cell) was elevated.  This can be seen in infections but also could be a maker of blood cell disorders.  It should be repeated by PCP.  Can someone let patient know?

## 2022-09-18 NOTE — Telephone Encounter (Signed)
 Pt informed and stated understanding

## 2022-09-22 ENCOUNTER — Ambulatory Visit: Payer: 59 | Admitting: Internal Medicine

## 2022-09-29 ENCOUNTER — Other Ambulatory Visit (HOSPITAL_COMMUNITY): Payer: Self-pay

## 2022-09-29 MED ORDER — NIRMATRELVIR&RITONAVIR 300/100 20 X 150 MG & 10 X 100MG PO TBPK
3.0000 | ORAL_TABLET | Freq: Two times a day (BID) | ORAL | 0 refills | Status: DC
  Filled 2022-09-29: qty 30, 5d supply, fill #0

## 2023-01-10 ENCOUNTER — Other Ambulatory Visit (HOSPITAL_BASED_OUTPATIENT_CLINIC_OR_DEPARTMENT_OTHER): Payer: Self-pay

## 2023-01-10 MED ORDER — FLULAVAL 0.5 ML IM SUSY
0.5000 mL | PREFILLED_SYRINGE | Freq: Once | INTRAMUSCULAR | 0 refills | Status: AC
  Filled 2023-01-10: qty 0.5, 1d supply, fill #0

## 2023-01-12 ENCOUNTER — Other Ambulatory Visit (HOSPITAL_BASED_OUTPATIENT_CLINIC_OR_DEPARTMENT_OTHER): Payer: Self-pay

## 2023-02-09 ENCOUNTER — Ambulatory Visit: Payer: 59 | Admitting: Internal Medicine

## 2023-02-09 ENCOUNTER — Encounter: Payer: Self-pay | Admitting: Internal Medicine

## 2023-02-09 VITALS — BP 130/80 | HR 108 | Temp 98.4°F | Ht 64.0 in | Wt 202.0 lb

## 2023-02-09 DIAGNOSIS — D841 Defects in the complement system: Secondary | ICD-10-CM | POA: Diagnosis not present

## 2023-02-09 DIAGNOSIS — M8589 Other specified disorders of bone density and structure, multiple sites: Secondary | ICD-10-CM | POA: Diagnosis not present

## 2023-02-09 DIAGNOSIS — L249 Irritant contact dermatitis, unspecified cause: Secondary | ICD-10-CM

## 2023-02-09 DIAGNOSIS — E785 Hyperlipidemia, unspecified: Secondary | ICD-10-CM

## 2023-02-09 DIAGNOSIS — G4733 Obstructive sleep apnea (adult) (pediatric): Secondary | ICD-10-CM

## 2023-02-09 DIAGNOSIS — M797 Fibromyalgia: Secondary | ICD-10-CM

## 2023-02-09 DIAGNOSIS — I10 Essential (primary) hypertension: Secondary | ICD-10-CM | POA: Diagnosis not present

## 2023-02-09 DIAGNOSIS — E1169 Type 2 diabetes mellitus with other specified complication: Secondary | ICD-10-CM

## 2023-02-09 DIAGNOSIS — E118 Type 2 diabetes mellitus with unspecified complications: Secondary | ICD-10-CM

## 2023-02-09 DIAGNOSIS — Z1159 Encounter for screening for other viral diseases: Secondary | ICD-10-CM

## 2023-02-09 DIAGNOSIS — M8580 Other specified disorders of bone density and structure, unspecified site: Secondary | ICD-10-CM | POA: Insufficient documentation

## 2023-02-09 DIAGNOSIS — Z7984 Long term (current) use of oral hypoglycemic drugs: Secondary | ICD-10-CM

## 2023-02-09 LAB — COMPREHENSIVE METABOLIC PANEL
ALT: 22 U/L (ref 0–35)
AST: 19 U/L (ref 0–37)
Albumin: 4.7 g/dL (ref 3.5–5.2)
Alkaline Phosphatase: 79 U/L (ref 39–117)
BUN: 12 mg/dL (ref 6–23)
CO2: 28 meq/L (ref 19–32)
Calcium: 10.2 mg/dL (ref 8.4–10.5)
Chloride: 103 meq/L (ref 96–112)
Creatinine, Ser: 0.68 mg/dL (ref 0.40–1.20)
GFR: 92.86 mL/min (ref >60.00)
Glucose, Bld: 131 mg/dL — ABNORMAL HIGH (ref 70–99)
Potassium: 4 meq/L (ref 3.5–5.1)
Sodium: 140 meq/L (ref 135–145)
Total Bilirubin: 0.6 mg/dL (ref 0.2–1.2)
Total Protein: 7.9 g/dL (ref 6.0–8.3)

## 2023-02-09 LAB — MICROALBUMIN / CREATININE URINE RATIO
Creatinine,U: 68.2 mg/dL
Microalb Creat Ratio: 2 mg/g (ref 0.0–30.0)
Microalb, Ur: 1.3 mg/dL (ref 0.0–1.9)

## 2023-02-09 LAB — VITAMIN D 25 HYDROXY (VIT D DEFICIENCY, FRACTURES): VITD: 54.46 ng/mL (ref 30.00–100.00)

## 2023-02-09 LAB — LIPID PANEL
Cholesterol: 140 mg/dL (ref 0–200)
HDL: 41.8 mg/dL (ref >39.00)
LDL Cholesterol: 66 mg/dL (ref 0–99)
NonHDL: 98.36
Total CHOL/HDL Ratio: 3
Triglycerides: 160 mg/dL — ABNORMAL HIGH (ref 0.0–149.0)
VLDL: 32 mg/dL (ref 0.0–40.0)

## 2023-02-09 LAB — CBC
HCT: 45 % (ref 36.0–46.0)
Hemoglobin: 14.9 g/dL (ref 12.0–15.0)
MCHC: 33 g/dL (ref 30.0–36.0)
MCV: 93.7 fL (ref 78.0–100.0)
Platelets: 277 10*3/uL (ref 150.0–400.0)
RBC: 4.81 Mil/uL (ref 3.87–5.11)
RDW: 13.3 % (ref 11.5–15.5)
WBC: 7.4 10*3/uL (ref 4.0–10.5)

## 2023-02-09 LAB — TSH: TSH: 1.41 u[IU]/mL (ref 0.35–5.50)

## 2023-02-09 LAB — HEMOGLOBIN A1C: Hgb A1c MFr Bld: 7 % — ABNORMAL HIGH (ref 4.6–6.5)

## 2023-02-09 MED ORDER — CYCLOBENZAPRINE HCL 5 MG PO TABS: 5.0000 mg | ORAL_TABLET | Freq: Three times a day (TID) | ORAL | 1 refills | Status: DC | PRN

## 2023-02-09 MED ORDER — METFORMIN HCL ER 500 MG PO TB24: 500.0000 mg | ORAL_TABLET | Freq: Three times a day (TID) | ORAL | 3 refills | Status: DC

## 2023-02-09 MED ORDER — VALSARTAN 40 MG PO TABS: 40.0000 mg | ORAL_TABLET | Freq: Every day | ORAL | 3 refills | Status: DC

## 2023-02-09 MED ORDER — ATORVASTATIN CALCIUM 10 MG PO TABS: 10.0000 mg | ORAL_TABLET | Freq: Every day | ORAL | 3 refills | Status: DC

## 2023-02-09 MED ORDER — CLOTRIMAZOLE-BETAMETHASONE 1-0.05 % EX CREA: 1.0000 | TOPICAL_CREAM | Freq: Every day | CUTANEOUS | 6 refills | Status: DC

## 2023-02-09 NOTE — Assessment & Plan Note (Signed)
Checking lipid panel and adjust atorvastatin as needed for goal LDL <100.

## 2023-02-09 NOTE — Assessment & Plan Note (Signed)
BP at goal on valsartan 40 mg daily and checking CMP. Rx done for this to continue. Adjust as needed.

## 2023-02-09 NOTE — Patient Instructions (Signed)
We will check all the labs today.

## 2023-02-09 NOTE — Progress Notes (Signed)
   Subjective:   Patient ID: Diana White, female    DOB: 08-13-1960, 62 y.o.   MRN: [id number]  HPI The patient is a new 62 YO female coming in for ongoing care with some questions. See A/P for details.   PMH, [facility], social history reviewed and updated  Review of Systems  Constitutional: Negative.   HENT: Negative.    Eyes: Negative.   Respiratory:  Negative for cough, chest tightness and shortness of breath.   Cardiovascular:  Negative for chest pain, palpitations and leg swelling.  Gastrointestinal:  Negative for abdominal distention, abdominal pain, constipation, diarrhea, nausea and vomiting.  Musculoskeletal:  Positive for myalgias.  Skin:  Positive for color change.  Neurological: Negative.   Psychiatric/Behavioral: Negative.      Objective:  Physical Exam Constitutional:      Appearance: She is well-developed.  HENT:     Head: Normocephalic and atraumatic.  Cardiovascular:     Rate and Rhythm: Normal rate and regular rhythm.  Pulmonary:     Effort: Pulmonary effort is normal. No respiratory distress.     Breath sounds: Normal breath sounds. No wheezing or rales.  Abdominal:     General: Bowel sounds are normal. There is no distension.     Palpations: Abdomen is soft.     Tenderness: There is no abdominal tenderness. There is no rebound.  Musculoskeletal:     Cervical back: Normal range of motion.  Skin:    General: Skin is warm and dry.     Comments: Dark skin elbows bilaterally, foot exam done  Neurological:     Mental Status: She is alert and oriented to person, place, and time.     Coordination: Coordination normal.     Vitals:   02/09/23 0844 02/09/23 0850 02/09/23 0900  BP: (!) 124/96 (!) 124/96 130/80  Pulse: (!) 108    Temp: 98.4 F (36.9 C)    TempSrc: Oral    SpO2: 97%    Weight: 202 lb (91.6 kg)    Height: 5\' 4"  (1.626 m)      Assessment & Plan:

## 2023-02-09 NOTE — Assessment & Plan Note (Signed)
On the elbow regions and rx betamethasone/clotrimazole to use.

## 2023-02-09 NOTE — Assessment & Plan Note (Signed)
She has rare flares of this and uses 2.5 mg flexeril prn. Rx done today for this. She is encouraged to work on adding exercise to help.

## 2023-02-09 NOTE — Assessment & Plan Note (Signed)
Checking HgA1c, microalbumin to creatinine ratio and lipid panel and CMP. She is taking metformin 500 mg TID without side effects. Refilled and adjust as needed. On ARB and statin. Eye exam up to date getting records.

## 2023-02-09 NOTE — Assessment & Plan Note (Signed)
Has seen allergy in the past and is supposed to take antihistamine daily. She remembers sometimes. Avoids her allergen triggers and has allergy to some food dyes. No current severe symptoms mild forehead swelling.

## 2023-02-09 NOTE — Assessment & Plan Note (Addendum)
Due for DEXA next year and getting records. She takes calcium and vitamin D over the counter. Counseled about need for exercise. Ordered vitamin D test and will adjust supplement as needed

## 2023-02-09 NOTE — Assessment & Plan Note (Signed)
On CPAP and doing well. Continue.

## 2023-02-10 LAB — HEPATITIS C ANTIBODY: Hepatitis C Ab: NONREACTIVE

## 2023-02-22 ENCOUNTER — Other Ambulatory Visit: Payer: Self-pay | Admitting: Obstetrics and Gynecology

## 2023-02-22 DIAGNOSIS — R1032 Left lower quadrant pain: Secondary | ICD-10-CM

## 2023-02-23 ENCOUNTER — Telehealth: Payer: Self-pay

## 2023-02-23 NOTE — Telephone Encounter (Signed)
Copied from CRM [id number]. Topic: Clinical - Medication Question >> Feb 23, 2023  2:45 PM [name] wrote:  Reason for CRM: pt called to find out if her medical records were  received, pt would like a release of information form emailed to her  at   [email or web address]  pti is requesting a call back [phone]

## 2023-02-23 NOTE — Telephone Encounter (Signed)
I have not seen anything

## 2023-03-02 ENCOUNTER — Ambulatory Visit
Admission: RE | Admit: 2023-03-02 | Discharge: 2023-03-02 | Disposition: A | Discharge status: Home / Self Care | Payer: 59 | Source: Ambulatory Visit | Attending: Obstetrics and Gynecology | Admitting: Obstetrics and Gynecology

## 2023-03-02 DIAGNOSIS — R1032 Left lower quadrant pain: Secondary | ICD-10-CM

## 2023-03-02 MED ORDER — IOPAMIDOL (ISOVUE-300) INJECTION 61%
100.0000 mL | Freq: Once | INTRAVENOUS | Status: AC | PRN
  Administered 2023-03-02: 100 mL via INTRAVENOUS

## 2023-05-22 ENCOUNTER — Ambulatory Visit (INDEPENDENT_AMBULATORY_CARE_PROVIDER_SITE_OTHER): Admitting: Internal Medicine

## 2023-05-22 ENCOUNTER — Encounter: Payer: Self-pay | Admitting: Internal Medicine

## 2023-05-22 ENCOUNTER — Other Ambulatory Visit: Payer: Self-pay

## 2023-05-22 VITALS — BP 130/78 | HR 106 | Temp 97.5°F | Resp 17 | Wt 207.6 lb

## 2023-05-22 DIAGNOSIS — T783XXD Angioneurotic edema, subsequent encounter: Secondary | ICD-10-CM | POA: Diagnosis not present

## 2023-05-22 DIAGNOSIS — J3089 Other allergic rhinitis: Secondary | ICD-10-CM

## 2023-05-22 DIAGNOSIS — L2389 Allergic contact dermatitis due to other agents: Secondary | ICD-10-CM | POA: Diagnosis not present

## 2023-05-22 MED ORDER — FAMOTIDINE 20 MG PO TABS: 20.0000 mg | ORAL_TABLET | Freq: Two times a day (BID) | ORAL | 0 refills | Status: AC

## 2023-05-22 NOTE — Progress Notes (Signed)
 FOLLOW UP Date of Service/Encounter:  05/22/23  Subjective:  Diana White (DOB: Jan 01, 1961) is a 63 y.o. female who [facility] in re-evaluation of the following: angioedema, contact dermatitis  History obtained from: chart review and patient.  For Review, LV was on 09/01/22  with Dr. Marlynn Perking seen for  patch testing read  . See below for summary of history and diagnostics.   Therapeutic plans/changes recommended: True test down  ----------------------------------------------------- Today presents for follow-up. Discussed the use of AI scribe software for clinical note transcription with the patient, who gave verbal consent to proceed.  History of Present Illness   Diana White is a 63 year old female who presents with episodes of angioedema. She is accompanied by her husband, who is concerned about the potential for more severe reactions.  She experiences recurrent episodes of swelling localized to her forehead, often preceded by a sensation of tingling or tightness. The swelling is most pronounced after consuming certain foods, such as hot dogs and popsicles, which she suspects may contain food dyes. Additionally, she has noted swelling after consuming nuts, leading her to eliminate them from her diet, though this did not completely resolve the issue.  She has tried various antihistamines, including Allegra 180 mg, which she recently started taking after previously using Allegra 60 mg twice daily. She also took Benadryl and Zyrtec, with varying degrees of adherence and effectiveness. Zyrtec causes drowsiness, and she has not consistently taken antihistamines twice daily as previously advised.  There is a family history of autoimmune conditions, particularly among her seven sisters, which raises her concern about potential immune-related causes for her symptoms. 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  No hair loss, changes to her scalp, rash, or gastrointestinal upset accompanying the swelling.     She has been avoiding hair dyes but may have some exposure to PPD with wigs.       All medications reviewed by clinical staff and updated in chart. No new pertinent medical or surgical history except as noted in HPI.  ROS: All others negative except as noted per HPI.   Objective:  BP 130/78   Pulse (!) 106   Temp (!) 97.5 F (36.4 C) (Temporal)   Resp 17   Wt 207 lb 9.6 oz (94.2 kg)   SpO2 99%   BMI 35.63 kg/m  Body mass index is 35.63 kg/m. Physical Exam: General Appearance:  Alert, cooperative, no distress, appears stated age  Head:  Normocephalic, without obvious abnormality, atraumatic  Eyes:  Conjunctiva clear, EOM's intact  Ears EACs normal bilaterally  Nose: Nares normal,     Throat: Lips, tongue normal; teeth and gums normal,     Neck: Supple, symmetrical  Lungs:     , Respirations unlabored, no coughing  Heart:    , Appears well perfused  Extremities: No edema  Skin: Skin color, texture, turgor normal and no rashes or lesions on visualized portions of skin  Neurologic: No gross deficits   Labs:  Lab Orders  No laboratory test(s) ordered today      Assessment/Plan   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, not well controlled  Labs (04/2022): negative for hereditary causes of angioedema, alpha gal, normal trypase, CU index True Test: positive to gold and PPD  Plan:  Maximize antihistamines to evaluate for  histamine responsiveness  Start:  Allegra 360mg  twice daily (4 tabs daily total)  Pepcid 20mg  twice daily (2 tabs daily total)   If good response to antihistamines may consider a trial of Xolair versus tapering antihistamines If no response to antihistamines may consider further patch testing with the [place] [employer] 80 series  Follow  up in 4 weeks   Other:     Thank you so much for letting me partake in your care today.  Don't hesitate to reach out if you have any additional concerns!  Ferol Luz, MD  [facility] [place] [place]

## 2023-05-22 NOTE — Patient Instructions (Addendum)
 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, not well controlled  Labs (04/2022): negative for hereditary causes of angioedema, alpha gal, normal trypase, CU index True Test: positive to gold and PPD  Plan:  Maximize antihistamines to evaluate for histamine responsiveness  Start:  Allegra 360mg  twice daily (4 tabs daily total)  Pepcid 20mg  twice daily (2 tabs daily total)   If good response to antihistamines may consider a trial of Xolair versus tapering antihistamines If no response to antihistamines may consider further patch testing with the [place] [employer] 80 series  Follow up in 4 weeks   Thank you so much for letting me partake in your care today.  Don't hesitate to reach out if you have any additional concerns!  Ferol Luz, MD  [facility] [place] [place]

## 2023-06-26 ENCOUNTER — Ambulatory Visit: Admitting: Internal Medicine

## 2023-07-05 ENCOUNTER — Ambulatory Visit: Payer: 59 | Admitting: Adult Health

## 2023-07-16 ENCOUNTER — Ambulatory Visit: Payer: Self-pay | Admitting: Internal Medicine

## 2023-07-16 ENCOUNTER — Encounter: Payer: Self-pay | Admitting: Internal Medicine

## 2023-07-16 VITALS — BP 136/82 | HR 100 | Temp 98.0°F | Ht 64.0 in | Wt 209.2 lb

## 2023-07-16 DIAGNOSIS — E785 Hyperlipidemia, unspecified: Secondary | ICD-10-CM

## 2023-07-16 DIAGNOSIS — I10 Essential (primary) hypertension: Secondary | ICD-10-CM | POA: Diagnosis not present

## 2023-07-16 DIAGNOSIS — E118 Type 2 diabetes mellitus with unspecified complications: Secondary | ICD-10-CM

## 2023-07-16 DIAGNOSIS — Z7984 Long term (current) use of oral hypoglycemic drugs: Secondary | ICD-10-CM

## 2023-07-16 DIAGNOSIS — E1169 Type 2 diabetes mellitus with other specified complication: Secondary | ICD-10-CM | POA: Diagnosis not present

## 2023-07-16 DIAGNOSIS — Z0000 Encounter for general adult medical examination without abnormal findings: Secondary | ICD-10-CM | POA: Insufficient documentation

## 2023-07-16 LAB — COMPREHENSIVE METABOLIC PANEL WITH GFR
ALT: 18 U/L (ref 0–35)
AST: 17 U/L (ref 0–37)
Albumin: 4.5 g/dL (ref 3.5–5.2)
Alkaline Phosphatase: 88 U/L (ref 39–117)
BUN: 11 mg/dL (ref 6–23)
CO2: 25 meq/L (ref 19–32)
Calcium: 10.1 mg/dL (ref 8.4–10.5)
Chloride: 107 meq/L (ref 96–112)
Creatinine, Ser: 0.67 mg/dL (ref 0.40–1.20)
GFR: 92.91 mL/min (ref >60.00)
Glucose, Bld: 112 mg/dL — ABNORMAL HIGH (ref 70–99)
Potassium: 4.4 meq/L (ref 3.5–5.1)
Sodium: 142 meq/L (ref 135–145)
Total Bilirubin: 0.5 mg/dL (ref 0.2–1.2)
Total Protein: 7.3 g/dL (ref 6.0–8.3)

## 2023-07-16 LAB — HEMOGLOBIN A1C: Hgb A1c MFr Bld: 7.1 % — ABNORMAL HIGH (ref 4.6–6.5)

## 2023-07-16 MED ORDER — ATORVASTATIN CALCIUM 10 MG PO TABS: 10.0000 mg | ORAL_TABLET | Freq: Every day | ORAL | 3 refills | Status: DC

## 2023-07-16 MED ORDER — METFORMIN HCL ER 500 MG PO TB24: 500.0000 mg | ORAL_TABLET | Freq: Three times a day (TID) | ORAL | 3 refills | Status: DC

## 2023-07-16 MED ORDER — VALSARTAN 40 MG PO TABS: 40.0000 mg | ORAL_TABLET | Freq: Every day | ORAL | 3 refills | Status: DC

## 2023-07-16 NOTE — Assessment & Plan Note (Signed)
 Checking Hga1c and adjust metformin  as needed. On ARB and statin. Reminded about eye exam. Foot exam up to date and UACR.

## 2023-07-16 NOTE — Assessment & Plan Note (Signed)
 BP at goal checking CMP today and adjust as needed valsartan .

## 2023-07-16 NOTE — Assessment & Plan Note (Signed)
 Lipid panel up to date continue lipitor.

## 2023-07-16 NOTE — Assessment & Plan Note (Signed)
 Flu shot yearly. Pneumonia checking records up to date. Shingrix checking records. Tetanus checking records. Colonoscopy up to date. Mammogram up to date with gyn, pap smear up to date with gyn she thinks. Counseled about sun safety and mole surveillance. Counseled about the dangers of distracted driving. Given 10 year screening recommendations.

## 2023-07-16 NOTE — Progress Notes (Signed)
   Subjective:   Patient ID: Diana White, female    DOB: [birth year], 63 y.o.   MRN: [id number]  HPI The patient is here for physical.  PMH, [facility], social history reviewed and updated  Review of Systems  Constitutional: Negative.   HENT: Negative.    Eyes: Negative.   Respiratory:  Negative for cough, chest tightness and shortness of breath.   Cardiovascular:  Negative for chest pain, palpitations and leg swelling.  Gastrointestinal:  Negative for abdominal distention, abdominal pain, constipation, diarrhea, nausea and vomiting.  Musculoskeletal: Negative.   Skin: Negative.   Neurological: Negative.   Psychiatric/Behavioral: Negative.      Objective:  Physical Exam Constitutional:      Appearance: She is well-developed.  HENT:     Head: Normocephalic and atraumatic.  Cardiovascular:     Rate and Rhythm: Normal rate and regular rhythm.  Pulmonary:     Effort: Pulmonary effort is normal. No respiratory distress.     Breath sounds: Normal breath sounds. No wheezing or rales.  Abdominal:     General: Bowel sounds are normal. There is no distension.     Palpations: Abdomen is soft.     Tenderness: There is no abdominal tenderness. There is no rebound.  Musculoskeletal:     Cervical back: Normal range of motion.  Skin:    General: Skin is warm and dry.  Neurological:     Mental Status: She is alert and oriented to person, place, and time.     Coordination: Coordination normal.     Vitals:   07/16/23 0842 07/16/23 0912  BP: (!) 140/88 136/82  Pulse: 100   Temp: 98 F (36.7 C)   SpO2: 96%   Weight: 209 lb 3.2 oz (94.9 kg)   Height: 5\' 4"  (1.626 m)     Assessment & Plan:

## 2023-07-18 ENCOUNTER — Ambulatory Visit: Payer: Self-pay | Admitting: Internal Medicine

## 2023-07-23 DIAGNOSIS — G4733 Obstructive sleep apnea (adult) (pediatric): Secondary | ICD-10-CM | POA: Diagnosis not present

## 2023-08-10 ENCOUNTER — Ambulatory Visit: Admitting: Internal Medicine

## 2023-10-30 ENCOUNTER — Ambulatory Visit: Admitting: Internal Medicine

## 2023-10-30 DIAGNOSIS — G4733 Obstructive sleep apnea (adult) (pediatric): Secondary | ICD-10-CM | POA: Diagnosis not present

## 2023-11-13 ENCOUNTER — Other Ambulatory Visit (HOSPITAL_BASED_OUTPATIENT_CLINIC_OR_DEPARTMENT_OTHER): Payer: Self-pay

## 2023-11-13 MED ORDER — FLUZONE 0.5 ML IM SUSY
0.5000 mL | PREFILLED_SYRINGE | Freq: Once | INTRAMUSCULAR | 0 refills | Status: AC
Start: 2023-11-13 — End: 2023-11-14
  Filled 2023-11-13: qty 0.5, 1d supply, fill #0

## 2023-11-21 ENCOUNTER — Other Ambulatory Visit: Payer: Self-pay | Admitting: Obstetrics and Gynecology

## 2023-11-21 DIAGNOSIS — Z0000 Encounter for general adult medical examination without abnormal findings: Secondary | ICD-10-CM

## 2023-12-24 ENCOUNTER — Other Ambulatory Visit (HOSPITAL_BASED_OUTPATIENT_CLINIC_OR_DEPARTMENT_OTHER): Payer: Self-pay

## 2023-12-24 MED ORDER — COMIRNATY 30 MCG/0.3ML IM SUSY
0.3000 mL | PREFILLED_SYRINGE | Freq: Once | INTRAMUSCULAR | 0 refills | Status: AC
  Filled 2023-12-24: qty 0.3, 1d supply, fill #0

## 2024-01-21 ENCOUNTER — Encounter: Payer: Self-pay | Admitting: Internal Medicine

## 2024-01-21 ENCOUNTER — Ambulatory Visit: Admitting: Internal Medicine

## 2024-01-21 VITALS — BP 120/80 | HR 113 | Temp 98.9°F | Ht 64.0 in | Wt 204.0 lb

## 2024-01-21 DIAGNOSIS — E118 Type 2 diabetes mellitus with unspecified complications: Secondary | ICD-10-CM | POA: Diagnosis not present

## 2024-01-21 DIAGNOSIS — E785 Hyperlipidemia, unspecified: Secondary | ICD-10-CM

## 2024-01-21 DIAGNOSIS — I10 Essential (primary) hypertension: Secondary | ICD-10-CM

## 2024-01-21 DIAGNOSIS — J069 Acute upper respiratory infection, unspecified: Secondary | ICD-10-CM | POA: Diagnosis not present

## 2024-01-21 DIAGNOSIS — D841 Defects in the complement system: Secondary | ICD-10-CM

## 2024-01-21 DIAGNOSIS — E1169 Type 2 diabetes mellitus with other specified complication: Secondary | ICD-10-CM

## 2024-01-21 LAB — COMPREHENSIVE METABOLIC PANEL WITH GFR
ALT: 18 U/L (ref 0–35)
AST: 18 U/L (ref 0–37)
Albumin: 4.6 g/dL (ref 3.5–5.2)
Alkaline Phosphatase: 101 U/L (ref 39–117)
BUN: 11 mg/dL (ref 6–23)
CO2: 27 meq/L (ref 19–32)
Calcium: 10.7 mg/dL — ABNORMAL HIGH (ref 8.4–10.5)
Chloride: 101 meq/L (ref 96–112)
Creatinine, Ser: 0.75 mg/dL (ref 0.40–1.20)
GFR: 84.32 mL/min (ref >60.00)
Glucose, Bld: 122 mg/dL — ABNORMAL HIGH (ref 70–99)
Potassium: 4.4 meq/L (ref 3.5–5.1)
Sodium: 139 meq/L (ref 135–145)
Total Bilirubin: 0.4 mg/dL (ref 0.2–1.2)
Total Protein: 7.9 g/dL (ref 6.0–8.3)

## 2024-01-21 LAB — CBC
HCT: 43.5 % (ref 36.0–46.0)
Hemoglobin: 14.5 g/dL (ref 12.0–15.0)
MCHC: 33.3 g/dL (ref 30.0–36.0)
MCV: 93 fl (ref 78.0–100.0)
Platelets: 281 K/uL (ref 150.0–400.0)
RBC: 4.68 Mil/uL (ref 3.87–5.11)
RDW: 13.2 % (ref 11.5–15.5)
WBC: 10.5 K/uL (ref 4.0–10.5)

## 2024-01-21 LAB — LIPID PANEL
Cholesterol: 125 mg/dL (ref 0–200)
HDL: 41.6 mg/dL (ref >39.00)
LDL Cholesterol: 43 mg/dL (ref 0–99)
NonHDL: 83.31
Total CHOL/HDL Ratio: 3
Triglycerides: 200 mg/dL — ABNORMAL HIGH (ref 0.0–149.0)
VLDL: 40 mg/dL (ref 0.0–40.0)

## 2024-01-21 LAB — MICROALBUMIN / CREATININE URINE RATIO
Creatinine,U: 85 mg/dL
Microalb Creat Ratio: 18.4 mg/g (ref 0.0–30.0)
Microalb, Ur: 1.6 mg/dL (ref 0.0–1.9)

## 2024-01-21 MED ORDER — ATORVASTATIN CALCIUM 10 MG PO TABS: 10.0000 mg | ORAL_TABLET | Freq: Every day | ORAL | 3 refills | Status: AC

## 2024-01-21 MED ORDER — VALSARTAN 40 MG PO TABS: 40.0000 mg | ORAL_TABLET | Freq: Every day | ORAL | 3 refills | Status: AC

## 2024-01-21 MED ORDER — CYCLOBENZAPRINE HCL 5 MG PO TABS: 5.0000 mg | ORAL_TABLET | Freq: Three times a day (TID) | ORAL | 1 refills | Status: AC | PRN

## 2024-01-21 MED ORDER — METFORMIN HCL ER 500 MG PO TB24: 500.0000 mg | ORAL_TABLET | Freq: Three times a day (TID) | ORAL | 3 refills | Status: AC

## 2024-01-21 NOTE — Progress Notes (Signed)
 Subjective:   Patient ID: Diana White, female    DOB: [birth year], 63 y.o.   MRN: [id number]  Discussed the use of AI scribe software for clinical note transcription with the patient, who gave verbal consent to proceed.  History of Present Illness Diana White is a 63 year old female who presents with sinus congestion and headache.  She has been experiencing sinus congestion and a severe headache for the past two days. The headache is severe, particularly when standing up quickly, but subsides with movement. Over-the-counter medications such as TheraFlu and Sudafed have been used to manage symptoms, along with Tylenol for pain relief. TheraFlu has helped soothe her throat, but she still experiences a tickle in her throat and occasional cough.  No dizziness, significant nasal discharge, ear pain, or fever. She reports a slight sore throat, but it is not severe. She has not been using her saline mist or Flonase regularly, which she usually uses to manage her symptoms. She suspects her symptoms might be related to fall allergies, as they have been worse this year.  She has been monitoring her blood pressure due to concerns about the effects of decongestants. Her diastolic blood pressure has been higher than usual, with readings such as 117/98 and 102, which is atypical for her. She is on Valsartan  for blood pressure management.  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 She is managing this through dietary changes, avoiding foods with eggs, nuts, and artificial colors. Her husband is supportive, helping her identify safe food brands. Despite these efforts, she finds it challenging to consistently manage her diet and symptoms.  Review of Systems  Constitutional:  Positive for activity change, appetite change and chills. Negative for fatigue, fever and unexpected weight change.  HENT:  Positive for congestion, postnasal drip, rhinorrhea and  sinus pressure. Negative for ear discharge, ear pain, sinus pain, sneezing, sore throat, tinnitus, trouble swallowing and voice change.   Eyes: Negative.   Respiratory:  Positive for cough. Negative for chest tightness, shortness of breath and wheezing.   Cardiovascular: Negative.  Negative for chest pain, palpitations and leg swelling.  Gastrointestinal: Negative.  Negative for abdominal distention, abdominal pain, constipation, diarrhea, nausea and vomiting.  Musculoskeletal:  Positive for myalgias.  Skin: Negative.   Neurological: Negative.   Psychiatric/Behavioral: Negative.      Objective:  Physical Exam Constitutional:      Appearance: She is well-developed.  HENT:     Head: Normocephalic and atraumatic.     Comments: Oropharynx with redness and clear drainage, nose with swollen turbinates, TMs normal bilaterally.  Neck:     Thyroid : No thyromegaly.  Cardiovascular:     Rate and Rhythm: Normal rate and regular rhythm.  Pulmonary:     Effort: Pulmonary effort is normal. No respiratory distress.     Breath sounds: Normal breath sounds. No wheezing or rales.  Abdominal:     General: Bowel sounds are normal. There is no distension.     Palpations: Abdomen is soft.     Tenderness: There is no abdominal tenderness.  Musculoskeletal:        General: Tenderness present.     Cervical back: Normal range of motion.  Lymphadenopathy:     Cervical: No cervical adenopathy.  Skin:    General: Skin is warm and dry.  Neurological:     Mental Status: She is alert and oriented to person, place, and time.     Coordination: Coordination  normal.     Vitals:   01/21/24 0914  BP: 120/80  Pulse: (!) 113  Temp: 98.9 F (37.2 C)  TempSrc: Oral  SpO2: 97%  Weight: 204 lb (92.5 kg)  Height: 5' 4 (1.626 m)    Assessment and Plan Assessment & Plan Acute upper respiratory infection with allergic rhinitis   Symptoms are likely due to a viral infection or allergies, with a negative  COVID test. Improvement is expected in 7-10 days. Continue symptomatic treatment with over-the-counter medications as needed. Consider using Flonase and saline mist to manage drainage. Monitor symptoms and report if there is no improvement in 7-10 days.  Essential hypertension   Elevated blood pressure is likely due to decongestants, with a current reading of 120/80 mmHg. Long-term control is important to prevent complications. Monitor blood pressure for 1-2 weeks after discontinuing cold medications. Consider switching to cold medications formulated for hypertension if needed. Report if blood pressure remains elevated after discontinuing cold medications.  Hereditary angioedema Forehead swelling is due to histamine intolerance and is managed with dietary modifications. Continue dietary modifications to avoid histamine triggers. Monitor symptoms and consider further evaluation if symptoms persist or worsen.

## 2024-01-21 NOTE — Patient Instructions (Signed)
 We will check the labs today.  This is likely a cold or allergies.

## 2024-01-22 LAB — HEMOGLOBIN A1C: Hgb A1c MFr Bld: 7.1 % — ABNORMAL HIGH (ref 4.6–6.5)

## 2024-01-24 ENCOUNTER — Ambulatory Visit: Payer: Self-pay | Admitting: Adult Health

## 2024-01-24 ENCOUNTER — Encounter: Payer: Self-pay | Admitting: Adult Health

## 2024-01-24 VITALS — BP 145/81 | HR 92 | Ht 63.0 in | Wt 204.8 lb

## 2024-01-24 DIAGNOSIS — G4733 Obstructive sleep apnea (adult) (pediatric): Secondary | ICD-10-CM

## 2024-01-24 NOTE — Patient Instructions (Signed)
 Continue using CPAP nightly and greater than 4 hours each night If your symptoms worsen or you develop new symptoms please let us  know.

## 2024-01-24 NOTE — Progress Notes (Signed)
 PATIENT: Diana White DOB: February 05, 1961  REASON FOR VISIT: follow up HISTORY FROM: patient PRIMARY NEUROLOGIST: Dr. Chalice  Chief Complaint  Patient presents with   Follow-up    Pt in 5  alone Pt here for cpap f/u Pt has questions about GLP1 and treating sleep apnea      HISTORY OF PRESENT ILLNESS: Today 01/24/24:  Diana White is a 63 y.o. female with a history of obstructive sleep apnea on CPAP. Returns today for follow-up.  She feels that the CPAP is working well for her.  Continues to find it beneficial.  Currently wearing the dreamwear mask.  Download is below     06/22/22: Diana White is a 63 y.o. female with a history of OSA on CPAP. Returns today for follow-up.  Reports that the CPAP machine is working well for her.  She likes her new machine.  Continues to find it beneficial.  Download is below       REVIEW OF SYSTEMS: Out of a complete 14 system review of symptoms, the patient complains only of the following symptoms, and all other reviewed systems are negative.  FSS 35 ESS 8  ALLERGIES: Allergies  Allergen Reactions   Red Dye #40 (Allura Red) Swelling    All artificial food dye. Forehead swelling   Eggshell Membrane (Chicken) [Egg Shells] Swelling    Histamine response   Gold-Containing Drug Products    Misc. Sulfonamide Containing Compounds Other (See Comments)   Walnut Itching   Sulfa Antibiotics Other (See Comments) and Rash    Mouth ulcers    HOME MEDICATIONS: Outpatient Medications Prior to Visit  Medication Sig Dispense Refill   acetaminophen (TYLENOL) 325 MG tablet Take 325 mg by mouth daily. (Patient taking differently: Take 500 mg by mouth as needed.)     Alum Hydroxide-Mag Carbonate (GAVISCON PO) Take by mouth. (Patient taking differently: Take by mouth daily as needed.)     atorvastatin  (LIPITOR) 10 MG tablet Take 1 tablet (10 mg total) by mouth daily. 90 tablet 3   Calcium -Magnesium-Vitamin D  (CALCIUM  500 PO) Take 1 tablet by mouth  daily. 600mg      CHOLECALCIFEROL PO Take 1,400 Units by mouth daily.     clotrimazole -betamethasone  (LOTRISONE ) cream Apply 1 Application topically daily. (Patient taking differently: Apply 1 Application topically as needed.) 45 g 6   cyclobenzaprine  (FLEXERIL ) 5 MG tablet Take 1 tablet (5 mg total) by mouth 3 (three) times daily as needed for muscle spasms. 90 tablet 1   Dextromethorphan-guaiFENesin (TUSSIN DM CLEAR PO) Take by mouth.     diclofenac  sodium (VOLTAREN ) 1 % GEL Apply 2 g topically 4 (four) times daily as needed (for pain).     diphenhydrAMINE (BENADRYL ALLERGY) 25 MG tablet Benadryl (Patient taking differently: Take 25 mg by mouth daily as needed.)     fluticasone (FLONASE ALLERGY RELIEF) 50 MCG/ACT nasal spray Spray 1 spray every day by intranasal route.     LORATADINE PO Take by mouth.     metFORMIN  (GLUCOPHAGE -XR) 500 MG 24 hr tablet Take 1 tablet (500 mg total) by mouth 3 (three) times daily. 270 tablet 3   Multiple Vitamins-Minerals (MULTIVITAMIN WITH MINERALS) tablet Take 1 tablet by mouth daily.     phenylephrine (SUDAFED PE) 10 MG TABS tablet Take 10 mg by mouth as directed.     SALINE NASAL SPRAY NA Place into the nose.     valsartan  (DIOVAN ) 40 MG tablet Take 1 tablet (40 mg total) by mouth daily.  90 tablet 3   famotidine  (PEPCID ) 20 MG tablet Take 1 tablet (20 mg total) by mouth 2 (two) times daily. (Patient not taking: Reported on 01/24/2024) 60 tablet 0   naproxen sodium (ALEVE) 220 MG tablet Take 220 mg by mouth daily as needed. (Patient not taking: Reported on 01/21/2024)     No facility-administered medications prior to visit.    PAST MEDICAL HISTORY: Past Medical History:  Diagnosis Date   Diabetes mellitus without complication (HCC)    Fibromyalgia    Narcolepsy    with shift work. Was treated with Provigil. 200 mg in the am and 100 mg at [facility]. Resolved when she stopped night shift.   Neuropathy    fibromyalgia   Osteopenia    Restless legs syndrome     Seasonal allergies    Vitamin D  deficiency     PAST SURGICAL HISTORY: Past Surgical History:  Procedure Laterality Date   carpal tunnel  Right 2011   CARPAL TUNNEL RELEASE     COLONOSCOPY WITH PROPOFOL  N/A 06/30/2022   Procedure: COLONOSCOPY WITH PROPOFOL ;  Surgeon: Rollin Dover, MD;  Location: [facility];  Service: Gastroenterology;  Laterality: N/A;   KNEE ARTHROSCOPY     POLYPECTOMY  06/30/2022   Procedure: POLYPECTOMY;  Surgeon: Rollin Dover, MD;  Location: [facility];  Service: Gastroenterology;;   TONSILLECTOMY      FAMILY HISTORY: Family History  Problem Relation Age of Onset   Coronary artery disease Father    Hypertension Father    Heart attack Sister    Rheum arthritis Sister    Lupus Sister    Sjogren's syndrome Sister    Sleep apnea Sister    Pulmonary fibrosis Sister    Diabetes Brother    Thyroid  disease Brother    Sleep apnea Brother     SOCIAL HISTORY: Social History   Socioeconomic History   Marital status: Married    Spouse name: Not on file   Number of children: Not on file   Years of education: Not on file   Highest education level: Not on file  Occupational History   Not on file  Tobacco Use   Smoking status: Never    Passive exposure: Never   Smokeless tobacco: Never  Vaping Use   Vaping status: Never Used  Substance and Sexual Activity   Alcohol use: Yes    Alcohol/week: 1.0 standard drink of alcohol    Types: 1 Cans of beer per week    Comment: socially   Drug use: No   Sexual activity: Yes  Other Topics Concern   Not on file  Social History Narrative   Lives with husband   Right handed   2 cups of caffeinated drinks  daily   Social Drivers of Corporate Investment Banker Strain: Not on file  Food Insecurity: Not on file  Transportation Needs: Not on file  Physical Activity: Not on file  Stress: Not on file  Social Connections: Not on file  Intimate Partner Violence: Not on file      PHYSICAL EXAM  Vitals:    01/24/24 1038  BP: (!) 145/81  Pulse: 92  Weight: 204 lb 12.8 oz (92.9 kg)  Height: 5' 3 (1.6 m)    Body mass index is 36.28 kg/m.  Generalized: Well developed, in no acute distress  Chest: Lungs clear to auscultation bilaterally  Neurological examination  Mentation: Alert oriented to time, place, history taking. Follows all commands speech and language fluent Cranial nerve II-XII: Facial  symmetry noted   DIAGNOSTIC DATA (LABS, IMAGING, TESTING) - I reviewed patient records, labs, notes, testing and imaging myself where available.  Lab Results  Component Value Date   WBC 10.5 01/21/2024   HGB 14.5 01/21/2024   HCT 43.5 01/21/2024   MCV 93.0 01/21/2024   PLT 281.0 01/21/2024      Component Value Date/Time   NA 139 01/21/2024 0950   NA 143 08/28/2022 1422   K 4.4 01/21/2024 0950   CL 101 01/21/2024 0950   CO2 27 01/21/2024 0950   GLUCOSE 122 (H) 01/21/2024 0950   BUN 11 01/21/2024 0950   BUN 14 08/28/2022 1422   CREATININE 0.75 01/21/2024 0950   CALCIUM  10.7 (H) 01/21/2024 0950   PROT 7.9 01/21/2024 0950   PROT 7.2 08/28/2022 1422   ALBUMIN 4.6 01/21/2024 0950   ALBUMIN 4.7 08/28/2022 1422   AST 18 01/21/2024 0950   ALT 18 01/21/2024 0950   ALKPHOS 101 01/21/2024 0950   BILITOT 0.4 01/21/2024 0950   BILITOT 0.3 08/28/2022 1422   GFRNONAA >90 08/31/2012 1358   GFRAA >90 08/31/2012 1358     ASSESSMENT AND PLAN 63 y.o. year old female  has a past medical history of Diabetes mellitus without complication (HCC), Fibromyalgia, Narcolepsy, Neuropathy, Osteopenia, Restless legs syndrome, Seasonal allergies, and Vitamin D  deficiency. here with:  OSA on CPAP  - CPAP compliance excellent - Good treatment of AHI  - Encourage patient to use CPAP nightly and > 4 hours each night - F/U in 1 year or sooner if needed    Duwaine Russell, MSN, NP-C 01/24/2024, 10:45 AM [facility] [address] [place], [place] [address] [phone]  The  patient's condition requires frequent monitoring and adjustments in the treatment plan, reflecting the ongoing complexity of care.  This provider is the continuing focal point for all needed services for this condition.

## 2024-01-25 ENCOUNTER — Ambulatory Visit: Payer: Self-pay | Admitting: Internal Medicine

## 2024-01-25 ENCOUNTER — Encounter: Payer: Self-pay | Admitting: Internal Medicine

## 2024-01-25 DIAGNOSIS — J069 Acute upper respiratory infection, unspecified: Secondary | ICD-10-CM | POA: Insufficient documentation

## 2024-01-25 NOTE — Assessment & Plan Note (Signed)
 Checking lipid panel and adjust as needed.

## 2024-01-25 NOTE — Assessment & Plan Note (Signed)
 Symptoms are likely due to a viral infection or allergies, with a negative COVID test. Improvement is expected in 7-10 days. Continue symptomatic treatment with over-the-counter medications as needed. Consider using Flonase and saline mist to manage drainage. Monitor symptoms and report if there is no improvement in 7-10 days.

## 2024-01-25 NOTE — Assessment & Plan Note (Signed)
 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 Continue dietary modifications to avoid histamine triggers. Monitor symptoms and consider further evaluation if symptoms persist or worsen.

## 2024-01-25 NOTE — Assessment & Plan Note (Signed)
 Checking labs including HgA1c, lipid panel, UACR, CMP and adjust as needed.

## 2024-01-25 NOTE — Assessment & Plan Note (Signed)
 Elevated blood pressure is likely due to decongestants, with a current reading of 120/80 mmHg. Long-term control is important to prevent complications. Monitor blood pressure for 1-2 weeks after discontinuing cold medications. Consider switching to cold medications formulated for hypertension if needed. Report if blood pressure remains elevated after discontinuing cold medications.

## 2024-02-11 ENCOUNTER — Other Ambulatory Visit: Payer: Self-pay | Admitting: Internal Medicine

## 2024-02-11 DIAGNOSIS — M797 Fibromyalgia: Secondary | ICD-10-CM | POA: Diagnosis not present

## 2024-02-11 DIAGNOSIS — M4727 Other spondylosis with radiculopathy, lumbosacral region: Secondary | ICD-10-CM | POA: Diagnosis not present

## 2024-02-11 DIAGNOSIS — M7062 Trochanteric bursitis, left hip: Secondary | ICD-10-CM | POA: Diagnosis not present

## 2024-02-19 ENCOUNTER — Inpatient Hospital Stay
Admission: RE | Admit: 2024-02-19 | Discharge: 2024-02-19 | Attending: Obstetrics and Gynecology | Admitting: Obstetrics and Gynecology

## 2024-02-19 DIAGNOSIS — Z1231 Encounter for screening mammogram for malignant neoplasm of breast: Secondary | ICD-10-CM | POA: Diagnosis not present

## 2024-02-19 DIAGNOSIS — Z01419 Encounter for gynecological examination (general) (routine) without abnormal findings: Secondary | ICD-10-CM | POA: Diagnosis not present

## 2024-02-19 DIAGNOSIS — Z124 Encounter for screening for malignant neoplasm of cervix: Secondary | ICD-10-CM | POA: Diagnosis not present

## 2024-02-19 DIAGNOSIS — Z6835 Body mass index (BMI) 35.0-35.9, adult: Secondary | ICD-10-CM | POA: Diagnosis not present

## 2024-02-19 DIAGNOSIS — Z0000 Encounter for general adult medical examination without abnormal findings: Secondary | ICD-10-CM

## 2024-02-19 DIAGNOSIS — M5416 Radiculopathy, lumbar region: Secondary | ICD-10-CM | POA: Diagnosis not present

## 2024-02-19 DIAGNOSIS — Z1151 Encounter for screening for human papillomavirus (HPV): Secondary | ICD-10-CM | POA: Diagnosis not present

## 2024-02-22 ENCOUNTER — Ambulatory Visit: Payer: Self-pay | Admitting: Internal Medicine

## 2025-01-26 ENCOUNTER — Ambulatory Visit: Admitting: Adult Health
